# Patient Record
Sex: Female | Born: 1980 | Race: White | Hispanic: No | Marital: Married | Smoking: Never smoker
Health system: Southern US, Community
[De-identification: ages and names within clinical notes are randomized; demographics above are authoritative.]

## PROBLEM LIST (undated history)

## (undated) DIAGNOSIS — G44009 Cluster headache syndrome, unspecified, not intractable: Secondary | ICD-10-CM

## (undated) DIAGNOSIS — Z800 Family history of malignant neoplasm of digestive organs: Secondary | ICD-10-CM

## (undated) DIAGNOSIS — O24419 Gestational diabetes mellitus in pregnancy, unspecified control: Secondary | ICD-10-CM

## (undated) DIAGNOSIS — Z808 Family history of malignant neoplasm of other organs or systems: Secondary | ICD-10-CM

## (undated) DIAGNOSIS — Z807 Family history of other malignant neoplasms of lymphoid, hematopoietic and related tissues: Secondary | ICD-10-CM

## (undated) DIAGNOSIS — Z8042 Family history of malignant neoplasm of prostate: Secondary | ICD-10-CM

## (undated) DIAGNOSIS — Z806 Family history of leukemia: Secondary | ICD-10-CM

## (undated) DIAGNOSIS — R51 Headache: Secondary | ICD-10-CM

## (undated) DIAGNOSIS — Z8481 Family history of carrier of genetic disease: Secondary | ICD-10-CM

## (undated) DIAGNOSIS — Z803 Family history of malignant neoplasm of breast: Secondary | ICD-10-CM

## (undated) DIAGNOSIS — F419 Anxiety disorder, unspecified: Secondary | ICD-10-CM

## (undated) DIAGNOSIS — F32A Depression, unspecified: Secondary | ICD-10-CM

## (undated) DIAGNOSIS — Z8619 Personal history of other infectious and parasitic diseases: Secondary | ICD-10-CM

## (undated) DIAGNOSIS — K589 Irritable bowel syndrome without diarrhea: Secondary | ICD-10-CM

## (undated) DIAGNOSIS — Z62810 Personal history of physical and sexual abuse in childhood: Secondary | ICD-10-CM

## (undated) DIAGNOSIS — Z801 Family history of malignant neoplasm of trachea, bronchus and lung: Secondary | ICD-10-CM

## (undated) HISTORY — DX: Family history of carrier of genetic disease: Z84.81

## (undated) HISTORY — DX: Personal history of physical and sexual abuse in childhood: Z62.810

## (undated) HISTORY — DX: Family history of malignant neoplasm of other organs or systems: Z80.8

## (undated) HISTORY — DX: Personal history of other infectious and parasitic diseases: Z86.19

## (undated) HISTORY — DX: Cluster headache syndrome, unspecified, not intractable: G44.009

## (undated) HISTORY — DX: Irritable bowel syndrome, unspecified: K58.9

## (undated) HISTORY — DX: Anxiety disorder, unspecified: F41.9

## (undated) HISTORY — DX: Family history of other malignant neoplasms of lymphoid, hematopoietic and related tissues: Z80.7

## (undated) HISTORY — DX: Depression, unspecified: F32.A

## (undated) HISTORY — DX: Family history of malignant neoplasm of trachea, bronchus and lung: Z80.1

## (undated) HISTORY — DX: Family history of leukemia: Z80.6

## (undated) HISTORY — PX: BREAST LUMPECTOMY: SHX2

## (undated) HISTORY — DX: Family history of malignant neoplasm of breast: Z80.3

## (undated) HISTORY — DX: Family history of malignant neoplasm of digestive organs: Z80.0

## (undated) HISTORY — PX: WISDOM TOOTH EXTRACTION: SHX21

## (undated) HISTORY — DX: Family history of malignant neoplasm of prostate: Z80.42

---

## 2011-07-24 ENCOUNTER — Other Ambulatory Visit: Payer: Self-pay | Admitting: Obstetrics & Gynecology

## 2011-07-24 DIAGNOSIS — Z803 Family history of malignant neoplasm of breast: Secondary | ICD-10-CM

## 2011-07-24 DIAGNOSIS — N63 Unspecified lump in unspecified breast: Secondary | ICD-10-CM

## 2011-07-28 ENCOUNTER — Other Ambulatory Visit: Payer: Self-pay | Admitting: Obstetrics & Gynecology

## 2011-07-28 ENCOUNTER — Ambulatory Visit
Admission: RE | Admit: 2011-07-28 | Discharge: 2011-07-28 | Disposition: A | Discharge status: Home / Self Care | Payer: 59 | Source: Ambulatory Visit | Attending: Obstetrics & Gynecology | Admitting: Obstetrics & Gynecology

## 2011-07-28 ENCOUNTER — Other Ambulatory Visit: Payer: Self-pay

## 2011-07-28 DIAGNOSIS — N63 Unspecified lump in unspecified breast: Secondary | ICD-10-CM

## 2011-07-28 DIAGNOSIS — Z803 Family history of malignant neoplasm of breast: Secondary | ICD-10-CM

## 2011-07-31 ENCOUNTER — Other Ambulatory Visit: Payer: Self-pay | Admitting: Obstetrics & Gynecology

## 2011-07-31 DIAGNOSIS — Z803 Family history of malignant neoplasm of breast: Secondary | ICD-10-CM

## 2011-07-31 DIAGNOSIS — N63 Unspecified lump in unspecified breast: Secondary | ICD-10-CM

## 2011-08-04 ENCOUNTER — Other Ambulatory Visit: Payer: Self-pay | Admitting: Obstetrics & Gynecology

## 2011-08-04 DIAGNOSIS — Z803 Family history of malignant neoplasm of breast: Secondary | ICD-10-CM

## 2011-08-04 DIAGNOSIS — N63 Unspecified lump in unspecified breast: Secondary | ICD-10-CM

## 2011-08-05 ENCOUNTER — Ambulatory Visit
Admission: RE | Admit: 2011-08-05 | Discharge: 2011-08-05 | Disposition: A | Discharge status: Home / Self Care | Payer: 59 | Source: Ambulatory Visit | Attending: Obstetrics & Gynecology | Admitting: Obstetrics & Gynecology

## 2011-08-05 ENCOUNTER — Inpatient Hospital Stay: Admission: RE | Admit: 2011-08-05 | Payer: 59 | Source: Ambulatory Visit

## 2011-08-05 ENCOUNTER — Other Ambulatory Visit: Payer: Self-pay | Admitting: Obstetrics & Gynecology

## 2011-08-05 DIAGNOSIS — Z803 Family history of malignant neoplasm of breast: Secondary | ICD-10-CM

## 2011-08-05 DIAGNOSIS — N63 Unspecified lump in unspecified breast: Secondary | ICD-10-CM

## 2011-08-05 DIAGNOSIS — N6310 Unspecified lump in the right breast, unspecified quadrant: Secondary | ICD-10-CM

## 2011-08-06 ENCOUNTER — Other Ambulatory Visit: Payer: Self-pay | Admitting: Obstetrics & Gynecology

## 2011-08-06 ENCOUNTER — Telehealth: Payer: Self-pay | Admitting: *Deleted

## 2011-08-06 ENCOUNTER — Telehealth: Payer: Self-pay | Admitting: Genetic Counselor

## 2011-08-06 ENCOUNTER — Ambulatory Visit (HOSPITAL_COMMUNITY)
Admission: RE | Admit: 2011-08-06 | Discharge: 2011-08-06 | Disposition: A | Discharge status: Home / Self Care | Payer: 59 | Source: Ambulatory Visit | Attending: Obstetrics & Gynecology | Admitting: Obstetrics & Gynecology

## 2011-08-06 DIAGNOSIS — Z803 Family history of malignant neoplasm of breast: Secondary | ICD-10-CM

## 2011-08-06 DIAGNOSIS — N6310 Unspecified lump in the right breast, unspecified quadrant: Secondary | ICD-10-CM

## 2011-08-06 DIAGNOSIS — N63 Unspecified lump in unspecified breast: Secondary | ICD-10-CM

## 2011-08-06 MED ORDER — GADOBENATE DIMEGLUMINE 529 MG/ML IV SOLN
13.0000 mL | Freq: Once | INTRAVENOUS | Status: AC | PRN
  Administered 2011-08-06: 13 mL via INTRAVENOUS

## 2011-08-06 NOTE — Telephone Encounter (Signed)
Confirmed 08/07/11 genetic appt w/ pt.  Emailed Leigh at [facility] to make her aware.  Took paperwork to Clydie Braun.

## 2011-08-06 NOTE — Telephone Encounter (Signed)
Asked patient about her sisters test result, which was negative.  Sister lives in [place].

## 2011-08-07 ENCOUNTER — Other Ambulatory Visit: Payer: Self-pay | Admitting: Obstetrics & Gynecology

## 2011-08-07 ENCOUNTER — Ambulatory Visit (HOSPITAL_BASED_OUTPATIENT_CLINIC_OR_DEPARTMENT_OTHER): Payer: 59 | Admitting: Genetic Counselor

## 2011-08-07 ENCOUNTER — Other Ambulatory Visit: Payer: 59 | Admitting: Lab

## 2011-08-07 ENCOUNTER — Encounter: Payer: Self-pay | Admitting: Genetic Counselor

## 2011-08-07 DIAGNOSIS — Z8042 Family history of malignant neoplasm of prostate: Secondary | ICD-10-CM

## 2011-08-07 DIAGNOSIS — Z9889 Other specified postprocedural states: Secondary | ICD-10-CM

## 2011-08-07 DIAGNOSIS — Z808 Family history of malignant neoplasm of other organs or systems: Secondary | ICD-10-CM

## 2011-08-07 DIAGNOSIS — Z803 Family history of malignant neoplasm of breast: Secondary | ICD-10-CM

## 2011-08-07 DIAGNOSIS — N63 Unspecified lump in unspecified breast: Secondary | ICD-10-CM

## 2011-08-07 DIAGNOSIS — Z801 Family history of malignant neoplasm of trachea, bronchus and lung: Secondary | ICD-10-CM

## 2011-08-07 NOTE — Progress Notes (Signed)
Dr.  Jennette Kettle requested a consultation for genetic counseling and risk assessment for Sharon [name], a 31 y.o. female, for discussion of her family history of breast cancer. She presents to clinic today to discuss the possibility of a genetic predisposition to cancer, and to further clarify her risks, as well as her family members' risks for cancer.   HISTORY OF PRESENT ILLNESS: Sharon [name] is a 31 y.o. female with no personal history of cancer.  She has had 3 breast biopsies at age 71 or 86 that were benign.  Recently several breast lumps were found on clinical exam.  She had a mammogram which found several areas and prompted a breast MRI yesterday.  She is awaiting the result of the MRI.  No past medical history on file.  No past surgical history on file.  History  Substance Use Topics  . Smoking status: Not on file  . Smokeless tobacco: Not on file  . Alcohol Use:     REPRODUCTIVE HISTORY AND PERSONAL RISK ASSESSMENT FACTORS: Menarche was at age 12.   Premenopausal Uterus Intact: Yes Ovaries Intact: Yes [id number] , first live birth at age N/A  She has not previously undergone treatment for infertility.   No OCP use   She has not used HRT in the past.    FAMILY HISTORY:  We obtained a detailed, 4-generation family history.  Significant diagnoses are listed below: Family History  Problem Relation Age of Onset  . Cancer Mother 70    IDC breast cancer, 5/19 nodes affected  . Cancer Father 40    lung cancer - fought forest fires for a living  . Cancer Sister 38    DCIS breast cancer x 2; BRCA negative  . Cancer Maternal Grandfather     prostate cancer (late 73s); ? esophogeal cancer (later 91s)   . Cancer Other     Brain cancer (17-21) in maternal grandfather's neice  The patient is awaiting results of her breast MRI that was performed yesterday, August 06, 2011.  Her younger sister was diagnosed with breast cancer last month at the age of 100.  She reportedly has tested negative for  BRCA mutations but it is unknown if further genetic testing has been performed.  The patient's father died of lung cancer at the age of 62.  He was a park ranger who fought forest fires.  Her mother was diagnosed with IDC breast cancer within the last two weeks at age 60.  The patient's maternal grandfather was diagnosed with prostate cancer in his 48s, throat cancer a few years later, and a third cancer that the patient did not know what it was.  He ultimately died at age 12 with dementia.  His sister's daughter was diagnosed with a brain cancer and died between the ages of 31 and 50.  There is no other known cancer history on either side of her family.  Patient's maternal ancestors are of [place] descent, and paternal ancestors are of [place] descent. There is no reported Ashkenazi Jewish ancestry. There is no  known consanguinity.  GENETIC COUNSELING RISK ASSESSMENT, DISCUSSION, AND SUGGESTED FOLLOW UP: We reviewed the natural history and genetic etiology of sporadic, familial and hereditary cancer syndromes.   About 5-10% of breast cancer is hereditary.  Of this, about 85% is the result of a BRCA1 or BRCA2 mutation.  We reviewed the red flags of hereditary cancer syndromes and the dominant inheritance patterns.   If the BRCA testing is negative, we discussed that  we could be testing for the wrong gene.  About 4% of women with breast cancer under the age of 58, who test negative for BRCA mutations, will have a TP53 mutation.  We discussed gene panels, and that several cancer genes that are associated with different cancers can be tested at the same time.  Because of the different types of cancer that are in the patient's family, we will consider one of the panel tests if she is negative for BRCA mutations.  We discussed performing testing on the patient today, vs. Trying to determine if her sister had additional testing pending.  The patient decided to wait on testing to learn about her MRI  results and whether her sister is being tested for other high risk breast cancer genes.  She will call back to schedule an appointment for a blood draw if needed.  The patient's personal and family history is suggestive of the following possible diagnosis: hereditary cancer syndrome  We discussed that identification of a hereditary cancer syndrome may help her care providers tailor the patients medical management. If a mutation indicating a hereditary cancer syndrome is detected in this case, the [employer] recommendations would include increased cancer surveillance and possible prophylactic surgery. If a mutation is detected, the patient will be referred back to the referring provider and to any additional appropriate care providers to discuss the relevant options.   If a mutation is not found in the patient, cancer surveillance options would be discussed for the patient according to the appropriate standard National Comprehensive Cancer Network and American Cancer Society guidelines, with consideration of their personal and family history risk factors. In this case, the patient will be referred back to their care providers for discussions of management.   After considering the risks, benefits, and limitations, the patient decided to put off testing for now until she learns the result of her MRI and whether her sister has pursued other genetic testing.   Per the patient's request, she will contact us by telephone to let us know her decision. A follow up genetic counseling visit will be scheduled if indicated.  The patient was seen for a total of 90 minutes, greater than 50% of which was spent face-to-face counseling.  This plan is being carried out per Dr. Donnetta Hail recommendations.  This note will also be sent to the referring provider via the electronic medical record. The patient will be supplied with a summary of this genetic counseling discussion as well as educational  information on the discussed hereditary cancer syndromes following the conclusion of their visit.   Patient was discussed with Dr. Drue Second.  CC BY [name] MAIL: Dr. Jennette Kettle, [facility]    _______________________________________________________________________ For Office Staff:  Number of people involved in session: 2 Was an Intern/ student involved with case: not applicable

## 2011-08-08 ENCOUNTER — Other Ambulatory Visit (HOSPITAL_COMMUNITY): Payer: 59

## 2011-08-18 ENCOUNTER — Ambulatory Visit
Admission: RE | Admit: 2011-08-18 | Discharge: 2011-08-18 | Disposition: A | Discharge status: Home / Self Care | Payer: 59 | Source: Ambulatory Visit | Attending: Obstetrics & Gynecology | Admitting: Obstetrics & Gynecology

## 2011-08-18 ENCOUNTER — Other Ambulatory Visit: Payer: 59

## 2011-08-18 ENCOUNTER — Other Ambulatory Visit: Payer: Self-pay | Admitting: Obstetrics & Gynecology

## 2011-08-18 DIAGNOSIS — N63 Unspecified lump in unspecified breast: Secondary | ICD-10-CM

## 2012-11-30 LAB — OB RESULTS CONSOLE ANTIBODY SCREEN: Antibody Screen: NEGATIVE

## 2012-11-30 LAB — OB RESULTS CONSOLE RUBELLA ANTIBODY, IGM: Rubella: IMMUNE

## 2012-11-30 LAB — OB RESULTS CONSOLE ABO/RH: RH Type: POSITIVE

## 2012-11-30 LAB — OB RESULTS CONSOLE RPR: RPR: NONREACTIVE

## 2012-11-30 LAB — OB RESULTS CONSOLE HIV ANTIBODY (ROUTINE TESTING): HIV: NONREACTIVE

## 2012-11-30 LAB — OB RESULTS CONSOLE HEPATITIS B SURFACE ANTIGEN: Hepatitis B Surface Ag: NEGATIVE

## 2012-11-30 LAB — OB RESULTS CONSOLE GC/CHLAMYDIA
Chlamydia: NEGATIVE
Gonorrhea: NEGATIVE

## 2012-12-14 ENCOUNTER — Other Ambulatory Visit: Payer: Self-pay | Admitting: Obstetrics & Gynecology

## 2012-12-14 DIAGNOSIS — N6310 Unspecified lump in the right breast, unspecified quadrant: Secondary | ICD-10-CM

## 2012-12-15 ENCOUNTER — Ambulatory Visit
Admission: RE | Admit: 2012-12-15 | Discharge: 2012-12-15 | Disposition: A | Discharge status: Home / Self Care | Payer: 59 | Source: Ambulatory Visit | Attending: Obstetrics & Gynecology | Admitting: Obstetrics & Gynecology

## 2012-12-15 ENCOUNTER — Other Ambulatory Visit: Payer: Self-pay | Admitting: Obstetrics & Gynecology

## 2012-12-15 DIAGNOSIS — N6310 Unspecified lump in the right breast, unspecified quadrant: Secondary | ICD-10-CM

## 2013-02-10 NOTE — L&D Delivery Note (Signed)
Delivery Note At 9:28 AM a viable female was delivered via Vaginal, Spontaneous Delivery (Presentation: Right Occiput Anterior).  APGAR: 4, 8; weight .   Placenta status: Intact, Spontaneous.  Cord: 3 vessels with the following complications: None.  Cord pH: na  Anesthesia: None  Episiotomy: none Lacerations: first degree Suture Repair: 2.0 vicryl rapide Est. Blood Loss (mL): 350  Mom to postpartum.  Baby to Couplet care / Skin to Skin.  Raphael GibneyJohn S Novalyn Lajara 06/28/2013, 9:46 AM

## 2013-06-25 IMAGING — US US CORE BIOPSY RIGHT
1 series · 13 of 15 positions shown · non-contrast
Comparison: Previous exams

***ADDENDUM*** CREATED: 08/19/2011 [DATE]

The right breast ultrasound guided core biopsy demonstrated a
benign fibroadenoma.  There were no atypical cells, no evidence for
malignancy,  and no findings worrisome for a phylloides tumor.  The
pathology is concordant with imaging findings.  I have discussed
the findings with the patient by telephone and answered her
questions.  The patient states that her biopsy site is clean and
dry without hematoma formation.  Post biopsy wound care
instructions were reviewed with the patient.  I recommend follow-up
bilateral breast ultrasound in 6 months. Also, breast self-
examination was reviewed with the patient.  The patient was
encouraged to call the [REDACTED] for additional questions or
concerns.
***END ADDENDUM*** SIGNED BY: Nabilo Saiche, M.D.
CLINICAL DATA: Family history of breast cancer in her mother at
age 60 and her sister at age 21.  Previous surgical excisions of
multiple bilateral benign fibroadenomas.  Multiple bilateral
circumscribed masses are now noted on breast ultrasound and breast
MRI most consistent with multiple residual fibroadenomas. Following
discussion with the patient with regards to her imaging findings
and her family history, the patient prefers ultrasound guided core
biopsy of the largest palpable mass (probable fibroadenoma) located
within the right breast at the 9 o'clock position. If this
demonstrates a benign fibroadenoma, ultrasound follow-up evaluation
of the additional lesions will be performed.
ULTRASOUND GUIDED CORE BIOPSY OF THE right BREAST

[Series 3: us core biopsy right · 13 of 15 slices shown]
[im 1/15]
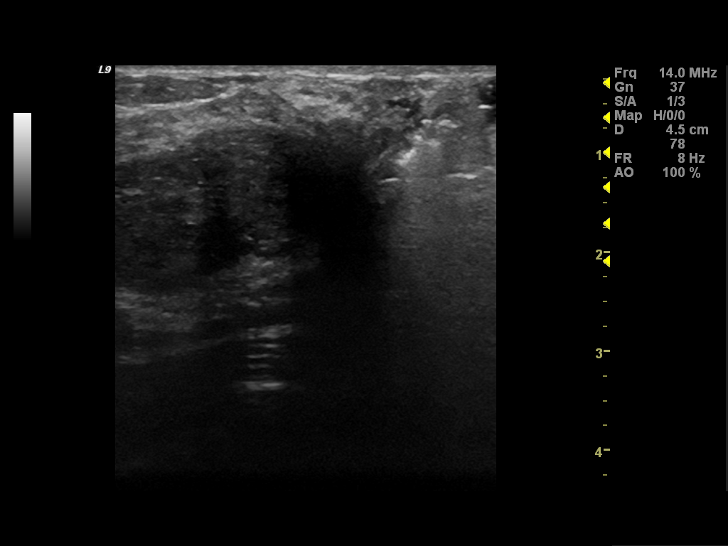
[im 2/15]
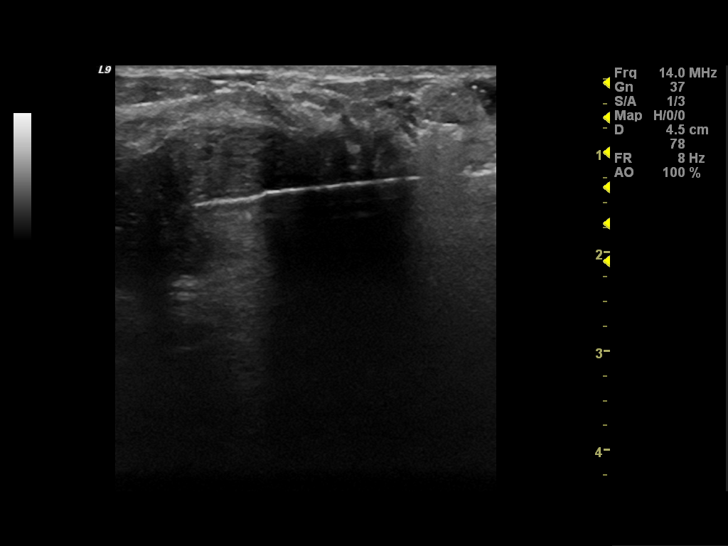
[im 3/15]
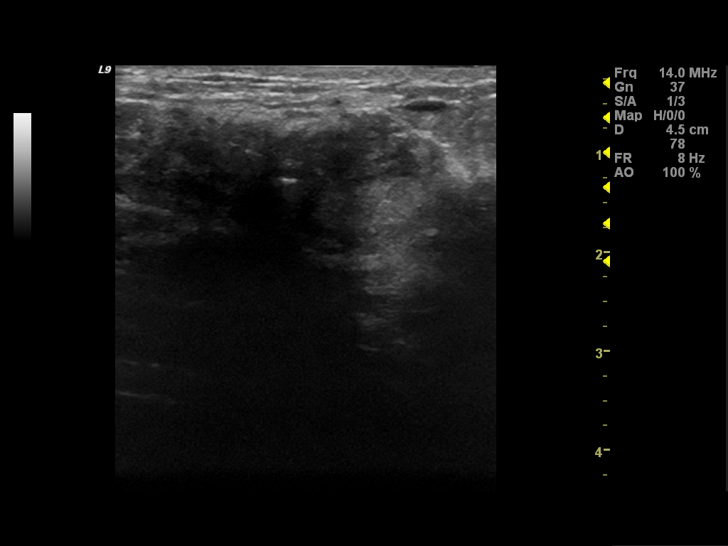
[im 5/15]
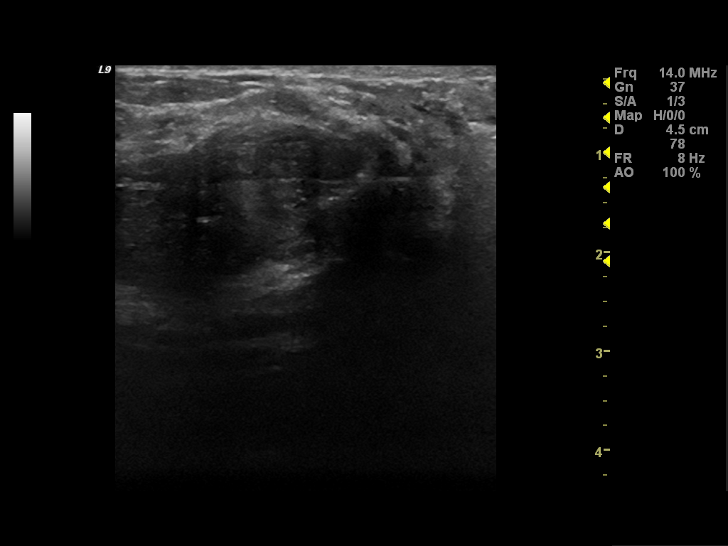
[im 6/15]
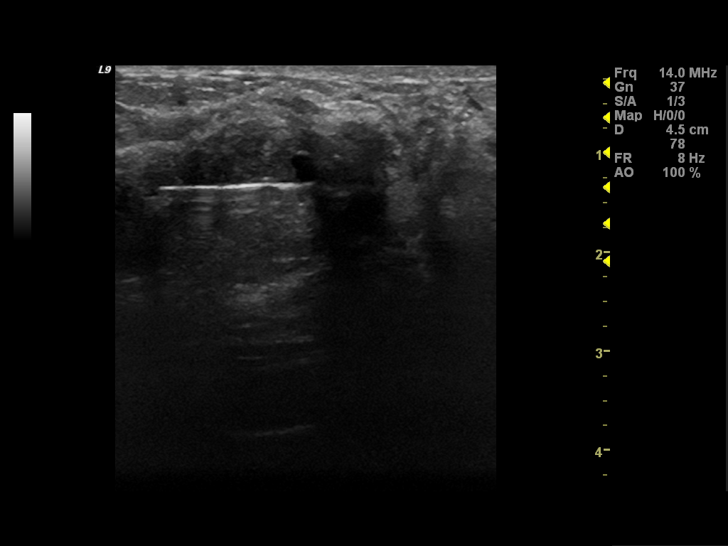
[im 7/15]
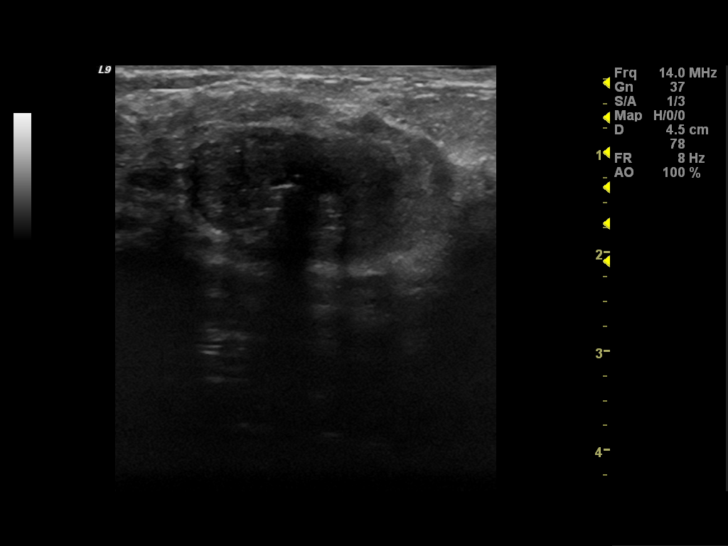
[im 8/15]
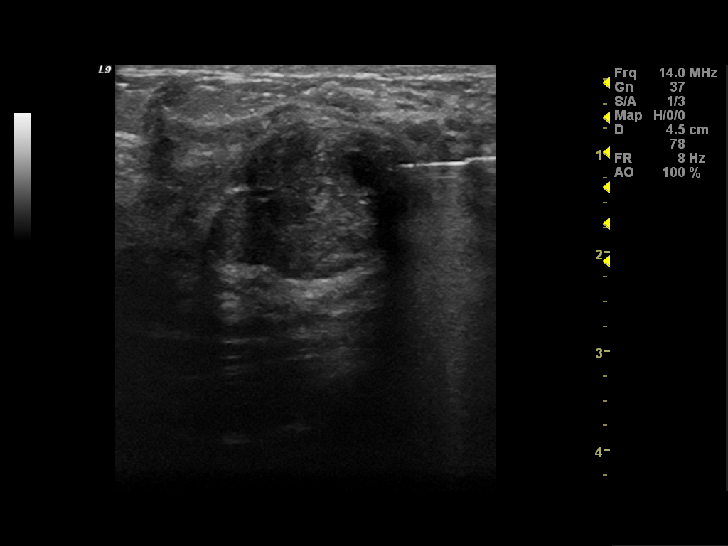
[im 9/15]
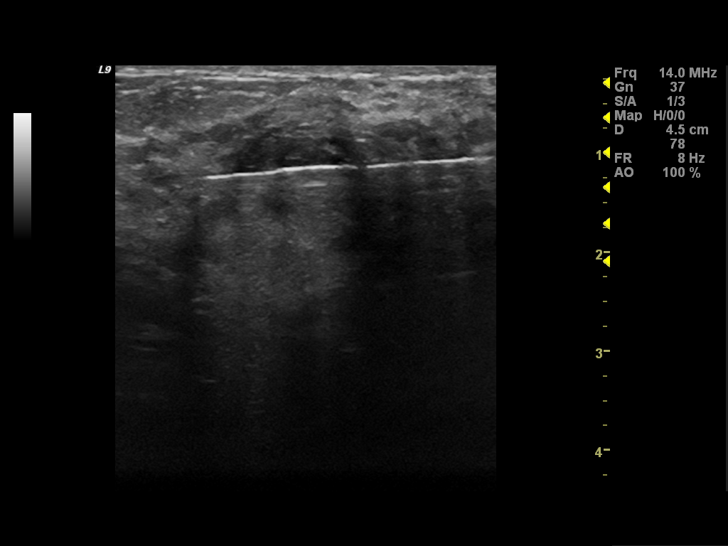
[im 10/15]
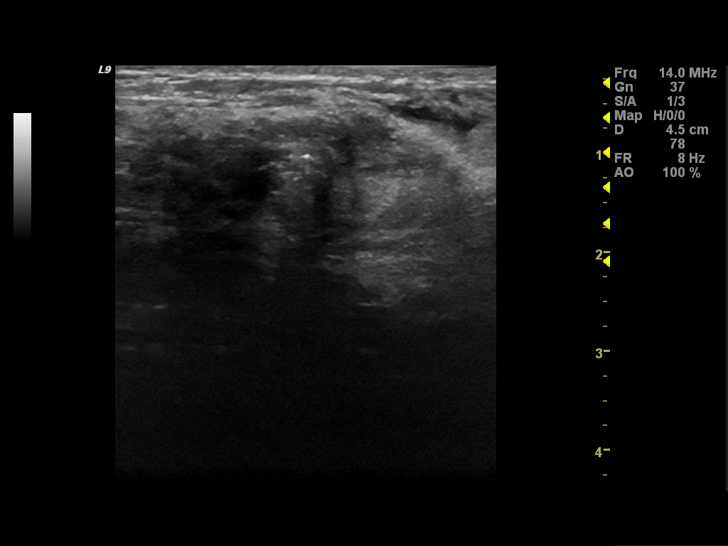
[im 11/15]
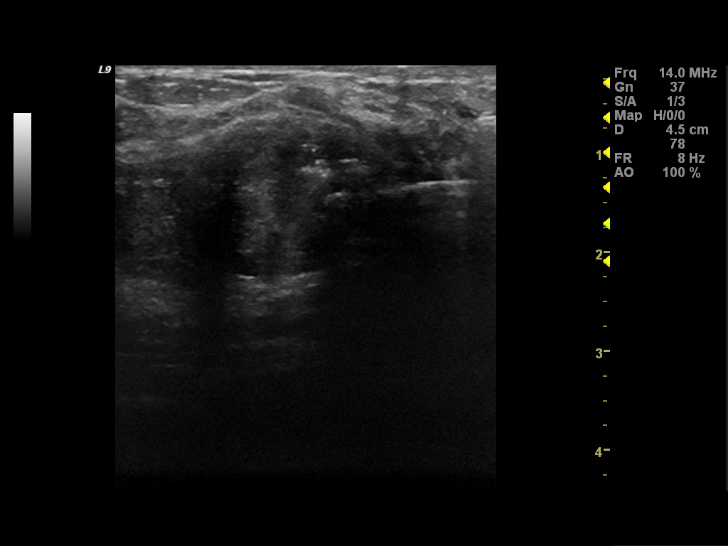
[im 13/15]
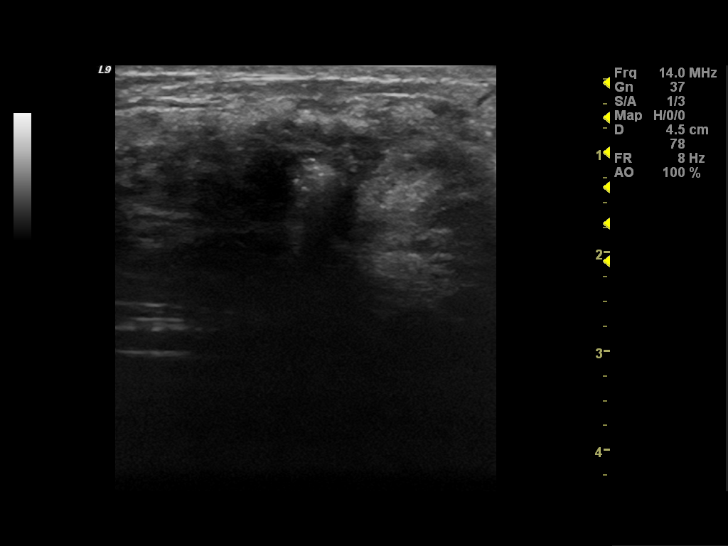
[im 14/15]
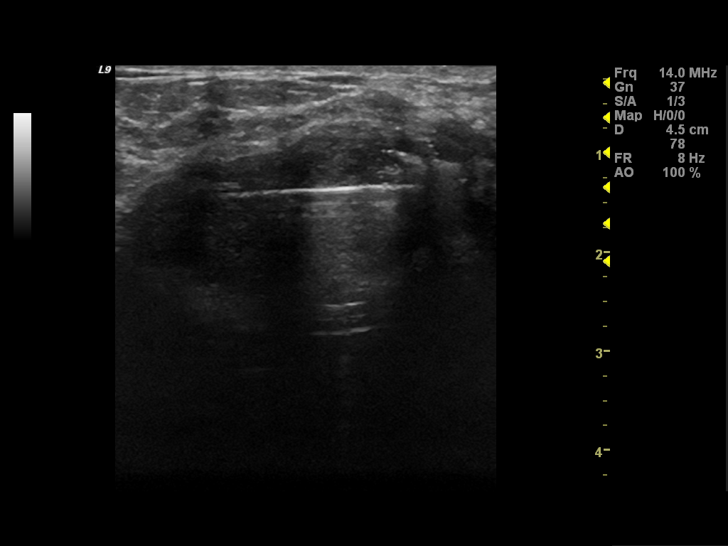
[im 15/15]
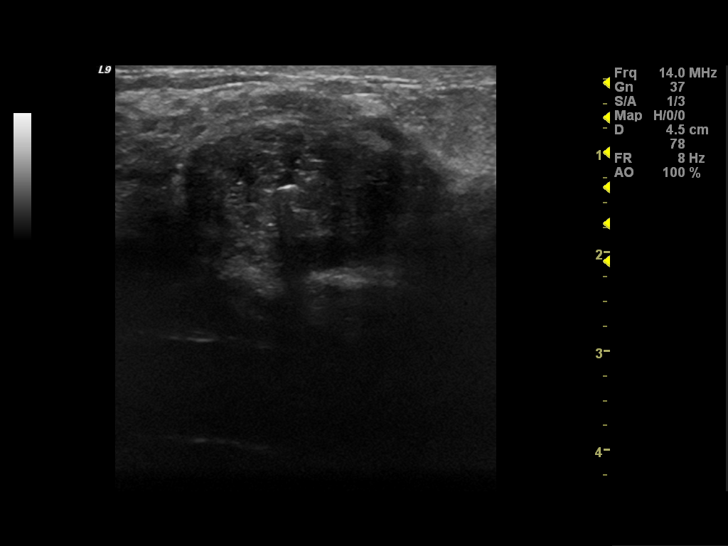

[13 of 15 positions shown; findings below may reference images not displayed]

I met with the patient and we discussed the procedure of ultrasound-
guided biopsy, including benefits and alternatives.  We discussed
the high likelihood of a successful procedure. We discussed the
risks of the procedure, including infection, bleeding, tissue
injury, clip migration, and inadequate sampling.  Informed written
consent was given.

Using sterile technique 2% lidocaine, ultrasound guidance and a 14
gauge automated biopsy device, biopsy was performed of the palpable
mass located within the right breast at the 9 o'clock position.  At
the conclusion of the procedure a ribbon shaped tissue marker clip
was deployed into the biopsy cavity. Due to the patient's age, a
post clip placement mammogram was not performed.
IMPRESSION: Ultrasound guided biopsy of the palpable mass located within the
right breast nine o'clock position.  No apparent complications.

## 2013-06-27 ENCOUNTER — Telehealth (HOSPITAL_COMMUNITY): Payer: Self-pay | Admitting: *Deleted

## 2013-06-27 ENCOUNTER — Encounter (HOSPITAL_COMMUNITY): Payer: Self-pay | Admitting: *Deleted

## 2013-06-27 ENCOUNTER — Encounter (HOSPITAL_COMMUNITY): Payer: Self-pay

## 2013-06-27 ENCOUNTER — Inpatient Hospital Stay (HOSPITAL_COMMUNITY)
Admission: AD | Admit: 2013-06-27 | Discharge: 2013-06-27 | Disposition: A | Discharge status: Home / Self Care | Payer: 59 | Source: Ambulatory Visit | Attending: Obstetrics & Gynecology | Admitting: Obstetrics & Gynecology

## 2013-06-27 DIAGNOSIS — O4690 Antepartum hemorrhage, unspecified, unspecified trimester: Secondary | ICD-10-CM | POA: Insufficient documentation

## 2013-06-27 HISTORY — DX: Headache: R51

## 2013-06-27 LAB — OB RESULTS CONSOLE GBS: GBS: NEGATIVE

## 2013-06-27 NOTE — MAU Note (Signed)
Pt noted bloody show, no clots or lof.

## 2013-06-27 NOTE — Progress Notes (Signed)
Notified pt here for bloody show, cervical exam 1.5/60/-1 intact. EFM tracing reactive. Will d/c pt. Have pt keep scheduled appointment in office today.

## 2013-06-27 NOTE — Telephone Encounter (Signed)
Preadmission screen  

## 2013-06-27 NOTE — MAU Note (Signed)
Patient states she has had bloody show this am that has gotten brighter red and more. States she is having mild pain, not sure if contractions. States baby was moving well this am but has not felt movement in about 20 minutes.

## 2013-06-27 NOTE — Discharge Instructions (Signed)
Labor Induction  Labor induction is when steps are taken to cause a pregnant woman to begin the labor process. Most women go into labor on their own between 37 weeks and 42 weeks of the pregnancy. When this does not happen or when there is a medical need, methods may be used to induce labor. Labor induction causes a pregnant woman's uterus to contract. It also causes the cervix to soften (ripen), open (dilate), and thin out (efface). Usually, labor is not induced before 39 weeks of the pregnancy unless there is a problem with the baby or mother.  Before inducing labor, your health care provider will consider a number of factors, including the following:  The medical condition of you and the baby.   How many weeks along you are.   The status of the baby's lung maturity.   The condition of the cervix.   The position of the baby.  WHAT ARE THE REASONS FOR LABOR INDUCTION? Labor may be induced for the following reasons:  The health of the baby or mother is at risk.   The pregnancy is overdue by 1 week or more.   The water breaks but labor does not start on its own.   The mother has a health condition or serious illness, such as high blood pressure, infection, placental abruption, or diabetes.  The amniotic fluid amounts are low around the baby.   The baby is distressed.  Convenience or wanting the baby to be born on a certain date is not a reason for inducing labor. WHAT METHODS ARE USED FOR LABOR INDUCTION? Several methods of labor induction may be used, such as:   Prostaglandin medicine. This medicine causes the cervix to dilate and ripen. The medicine will also start contractions. It can be taken by mouth or by inserting a suppository into the vagina.   Inserting a thin tube (catheter) with a balloon on the end into the vagina to dilate the cervix. Once inserted, the balloon is expanded with water, which causes the cervix to open.   Stripping the membranes. Your health  care provider separates amniotic sac tissue from the cervix, causing the cervix to be stretched and causing the release of a hormone called progesterone. This may cause the uterus to contract. It is often done during an office visit. You will be sent home to wait for the contractions to begin. You will then come in for an induction.   Breaking the water. Your health care provider makes a hole in the amniotic sac using a small instrument. Once the amniotic sac breaks, contractions should begin. This may still take hours to see an effect.   Medicine to trigger or strengthen contractions. This medicine is given through an IV access tube inserted into a vein in your arm.  All of the methods of induction, besides stripping the membranes, will be done in the hospital. Induction is done in the hospital so that you and the baby can be carefully monitored.  HOW LONG DOES IT TAKE FOR LABOR TO BE INDUCED? Some inductions can take up to 2 3 days. Depending on the cervix, it usually takes less time. It takes longer when you are induced early in the pregnancy or if this is your first pregnancy. If a mother is still pregnant and the induction has been going on for 2 3 days, either the mother will be sent home or a cesarean delivery will be needed. WHAT ARE THE RISKS ASSOCIATED WITH LABOR INDUCTION? Some of the risks   of induction include:   Changes in fetal heart rate, such as too high, too low, or erratic.   Fetal distress.   Chance of infection for the mother and baby.   Increased chance of having a cesarean delivery.   Breaking off (abruption) of the placenta from the uterus (rare).   Uterine rupture (very rare).  When induction is needed for medical reasons, the benefits of induction may outweigh the risks. WHAT ARE SOME REASONS FOR NOT INDUCING LABOR? Labor induction should not be done if:   It is shown that your baby does not tolerate labor.   You have had previous surgeries on your  uterus, such as a myomectomy or the removal of fibroids.   Your placenta lies very low in the uterus and blocks the opening of the cervix (placenta previa).   Your baby is not in a head-down position.   The umbilical cord drops down into the birth canal in front of the baby. This could cut off the baby's blood and oxygen supply.   You have had a previous cesarean delivery.   There are unusual circumstances, such as the baby being extremely premature.  Document Released: 06/18/2006 Document Revised: 09/29/2012 Document Reviewed: 08/26/2012 [facility] Patient Information 2014 [place], [place].  Natural Childbirth Natural childbirth is going through labor and delivery without any drugs to relieve pain. You also do not use fetal monitors, have a cesarean delivery, or get a sugical cut to enlarge the vaginal opening (episiotomy). With the help of a birthing professional (midwife), you will direct your own labor and delivery as you choose. Many women chose natural childbirth because they feel more in control and in touch with their labor and delivery. They are also concerned about the medications affecting themselves and the baby. Pregnant women with a high risk pregnancy should not attempt natural childbirth. It is better to deliver the infant in a hospital if an emergency situation arises. Sometimes, the caregiver has to intervene for the health and safety of the mother and infant. TWO TECHNIQUES FOR NATURAL CHILDBIRTH:   The Lamaze method. This method teaches women that having a baby is normal, healthy, and natural. It also teaches the mother to take a neutral position regarding pain medication and anesthesia and to make an informed decision if and when it is right for them.  The Erven CollaBradley method (also called husband coached birth). This method teaches the father to be the birth coach and stresses a natural approach. It also encourages exercise and a balanced diet with good nutrition. The  exercises teach relaxation and deep breathing techniques. However, there are also classes to prepare the parents for an emergency situation that may occur. METHODS OF DEALING WITH LABOR PAIN AND DELIVERY:  Meditation.  Yoga.  Hypnosis.  Acupuncture.  Massage.  Changing positions (walking, rocking, showering, leaning on birth balls).  Lying in warm water or a jacuzzi.  Find an activity that keeps your mind off of the labor pain.  Listen to soft music.  Visual imagery (focus on a particular object). BEFORE GOING INTO LABOR  Be sure you and your spouse/partner are in agreement to have natural childbirth.  Decide if your caregiver or a midwife will deliver your baby.  Decide if you will have your baby in the hospital, birthing center, or at home.  If you have children, make plans to have someone to take care of them when you go to the hospital.  Know the distance and the time it takes to go to  the delivery center. Make a dry run to be sure.  Have a bag packed with a night gown, bathrobe, and toiletries ready to take when you go into labor.  Keep phone numbers of your family and friends handy if you need to call someone when you go into labor.  Your spouse or partner should go to all the teaching classes.  Talk with your caregiver about the possibility of a medical emergency and what will happen if that occurs. ADVANTAGES OF NATURAL CHILDBIRTH  You are in control of your labor and delivery.  It is safe.  There are no medications or anesthetics that may affect you and the fetus.  There are no invasive procedures such as an episiotomy.  You and your partner will work together, which can increase your bond.  Meditation, yoga, massage, and breathing exercises can be learned while pregnant and help you when you are in labor and at delivery.  In most delivery centers, the family and friends can be involved in the labor and delivery process. DISADVANTAGES OF NATURAL  CHILDBIRTH  You will experience pain during your labor and delivery.  The methods of helping relieve your labor pains may not work for you.  You may feel embarrassed, disappointed, and like a failure if you decide to change your mind during labor and not have natural childbirth. AFTER THE DELIVERY  You will be very tired.  You will be uncomfortable because of your uterus contracting. You will feel soreness around the vagina.  You may feel cold and shaky.This is a natural reaction.  You will be excited, overwhelmed, accomplished, and proud to be a mother. HOME CARE INSTRUCTIONS   Follow the advice and instructions of your caregiver.  Follow the instructions of your natural childbirth instructor (Lamaze or Bradley Method). Document Released: 01/10/2008 Document Revised: 04/21/2011 Document Reviewed: 10/04/2012 [facility] Patient Information 2014 [place]ExitCare, [place].

## 2013-06-28 ENCOUNTER — Inpatient Hospital Stay (HOSPITAL_COMMUNITY): Admission: RE | Admit: 2013-06-28 | Payer: 59 | Source: Ambulatory Visit

## 2013-06-28 ENCOUNTER — Encounter (HOSPITAL_COMMUNITY): Payer: Self-pay | Admitting: *Deleted

## 2013-06-28 ENCOUNTER — Inpatient Hospital Stay (HOSPITAL_COMMUNITY)
Admission: AD | Admit: 2013-06-28 | Discharge: 2013-06-30 | DRG: 775 | Disposition: A | Discharge status: Home / Self Care | Payer: 59 | Source: Ambulatory Visit | Attending: Obstetrics and Gynecology | Admitting: Obstetrics and Gynecology

## 2013-06-28 DIAGNOSIS — K589 Irritable bowel syndrome without diarrhea: Secondary | ICD-10-CM | POA: Diagnosis present

## 2013-06-28 DIAGNOSIS — Z3490 Encounter for supervision of normal pregnancy, unspecified, unspecified trimester: Secondary | ICD-10-CM

## 2013-06-28 LAB — CBC
HCT: 39.5 % (ref 36.0–46.0)
Hemoglobin: 14.1 g/dL (ref 12.0–15.0)
MCH: 31.1 pg (ref 26.0–34.0)
MCHC: 35.7 g/dL (ref 30.0–36.0)
MCV: 87.2 fL (ref 78.0–100.0)
Platelets: 242 10*3/uL (ref 150–400)
RBC: 4.53 MIL/uL (ref 3.87–5.11)
RDW: 13.1 % (ref 11.5–15.5)
WBC: 18.3 10*3/uL — ABNORMAL HIGH (ref 4.0–10.5)

## 2013-06-28 LAB — RPR

## 2013-06-28 MED ORDER — DIPHENHYDRAMINE HCL 25 MG PO CAPS: 25.0000 mg | ORAL_CAPSULE | Freq: Four times a day (QID) | ORAL | Status: DC | PRN

## 2013-06-28 MED ORDER — CITRIC ACID-SODIUM CITRATE 334-500 MG/5ML PO SOLN: 30.0000 mL | ORAL | Status: DC | PRN

## 2013-06-28 MED ORDER — ONDANSETRON HCL 4 MG/2ML IJ SOLN: 4.0000 mg | Freq: Four times a day (QID) | INTRAMUSCULAR | Status: DC | PRN

## 2013-06-28 MED ORDER — OXYTOCIN BOLUS FROM INFUSION
500.0000 mL | INTRAVENOUS | Status: DC
  Administered 2013-06-28: 500 mL via INTRAVENOUS

## 2013-06-28 MED ORDER — ZOLPIDEM TARTRATE 5 MG PO TABS: 5.0000 mg | ORAL_TABLET | Freq: Every evening | ORAL | Status: DC | PRN

## 2013-06-28 MED ORDER — IBUPROFEN 600 MG PO TABS: 600.0000 mg | ORAL_TABLET | Freq: Four times a day (QID) | ORAL | Status: DC | PRN

## 2013-06-28 MED ORDER — ONDANSETRON HCL 4 MG PO TABS: 4.0000 mg | ORAL_TABLET | ORAL | Status: DC | PRN

## 2013-06-28 MED ORDER — ACETAMINOPHEN 325 MG PO TABS: 650.0000 mg | ORAL_TABLET | ORAL | Status: DC | PRN

## 2013-06-28 MED ORDER — LANOLIN HYDROUS EX OINT: TOPICAL_OINTMENT | CUTANEOUS | Status: DC | PRN

## 2013-06-28 MED ORDER — FLEET ENEMA 7-19 GM/118ML RE ENEM: 1.0000 | ENEMA | RECTAL | Status: DC | PRN

## 2013-06-28 MED ORDER — OXYTOCIN 40 UNITS IN LACTATED RINGERS INFUSION - SIMPLE MED
62.5000 mL/h | INTRAVENOUS | Status: DC
  Administered 2013-06-28: 62.5 mL/h via INTRAVENOUS
  Filled 2013-06-28: qty 1000

## 2013-06-28 MED ORDER — ONDANSETRON HCL 4 MG/2ML IJ SOLN: 4.0000 mg | INTRAMUSCULAR | Status: DC | PRN

## 2013-06-28 MED ORDER — OXYCODONE-ACETAMINOPHEN 5-325 MG PO TABS: 1.0000 | ORAL_TABLET | ORAL | Status: DC | PRN

## 2013-06-28 MED ORDER — BISACODYL 10 MG RE SUPP: 10.0000 mg | Freq: Every day | RECTAL | Status: DC | PRN

## 2013-06-28 MED ORDER — LIDOCAINE HCL (PF) 1 % IJ SOLN
30.0000 mL | INTRAMUSCULAR | Status: AC | PRN
  Administered 2013-06-28: 30 mL via SUBCUTANEOUS
  Filled 2013-06-28: qty 30

## 2013-06-28 MED ORDER — BENZOCAINE-MENTHOL 20-0.5 % EX AERO
1.0000 "application " | INHALATION_SPRAY | CUTANEOUS | Status: DC | PRN
  Filled 2013-06-28: qty 56

## 2013-06-28 MED ORDER — FLEET ENEMA 7-19 GM/118ML RE ENEM: 1.0000 | ENEMA | Freq: Every day | RECTAL | Status: DC | PRN

## 2013-06-28 MED ORDER — SIMETHICONE 80 MG PO CHEW: 80.0000 mg | CHEWABLE_TABLET | ORAL | Status: DC | PRN

## 2013-06-28 MED ORDER — PRENATAL MULTIVITAMIN CH
1.0000 | ORAL_TABLET | Freq: Every day | ORAL | Status: DC
  Administered 2013-06-28 – 2013-06-29 (×2): 1 via ORAL
  Filled 2013-06-28 (×2): qty 1

## 2013-06-28 MED ORDER — IBUPROFEN 600 MG PO TABS
600.0000 mg | ORAL_TABLET | Freq: Four times a day (QID) | ORAL | Status: DC
  Administered 2013-06-28 – 2013-06-30 (×7): 600 mg via ORAL
  Filled 2013-06-28 (×7): qty 1

## 2013-06-28 MED ORDER — WITCH HAZEL-GLYCERIN EX PADS: 1.0000 "application " | MEDICATED_PAD | CUTANEOUS | Status: DC | PRN

## 2013-06-28 MED ORDER — LACTATED RINGERS IV SOLN: INTRAVENOUS | Status: DC

## 2013-06-28 MED ORDER — DIBUCAINE 1 % RE OINT: 1.0000 "application " | TOPICAL_OINTMENT | RECTAL | Status: DC | PRN

## 2013-06-28 MED ORDER — SENNOSIDES-DOCUSATE SODIUM 8.6-50 MG PO TABS
2.0000 | ORAL_TABLET | ORAL | Status: DC
  Administered 2013-06-29 (×2): 2 via ORAL
  Filled 2013-06-28 (×3): qty 2

## 2013-06-28 MED ORDER — TETANUS-DIPHTH-ACELL PERTUSSIS 5-2.5-18.5 LF-MCG/0.5 IM SUSP: 0.5000 mL | Freq: Once | INTRAMUSCULAR | Status: DC

## 2013-06-28 MED ORDER — LACTATED RINGERS IV SOLN
500.0000 mL | INTRAVENOUS | Status: DC | PRN
  Administered 2013-06-28: 500 mL via INTRAVENOUS
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
## 2013-06-28 NOTE — Lactation Note (Addendum)
This note was copied from the chart of Girl Rollen SoxMary Erb. 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 Mom reports breast changes 3 x's during pregnancy.  Per mom baby hasn't latched yet . LC assessed breast tissue and noted mom to have semi flat nipple on the left and flat on the right. Requiring intervention of shells , hand pump , which LC changed to a DEBP at consult , nipple shield ( sized for both #16 and #20 NS and the #20 NS fit best ) . Baby latched with #20 NS on the left breast and was sitting with the nipple shield in her mouth , after 20 mins suckled per mom for a few strong sucks. Colostrum noted on the areola and base of the  Nipple shield . LC applied the colostrum on baby's lips and baby noted to be spitty intermittently.  Left nipple Corena Pilgrim/areola a borderline candidate for a nipple shield and the right isn't a candidate for nipple shield at this point in time . ( probably after the use shells , and consistent  Pumping the areola will become more compressible and the nipple more erect for a better latch . Per mom my breast are not normally like this and they feel full , ( Edema bilaterally noted )  Lactation plan of care -  due to challenging tissue semi flat on the left , and flat on the right , per mom prior to delivery her tissue was more erect , probably a DEBP will be needed early on due to challenging tissue , mom aware . see LC note  Breast massage , prepump both breast for 5 mins and apply #20 Nipple shield , if EBM available ( instill EBM with curved syringe ) , latch with firm support and let baby feed ( 10 -20 mins) or greater . After feeding at the breast post pump 10 mins and save milk. Unable to hand express milk to spoon feed . Mom  has been doing a lot of skin to skin.  Discussed with mom if the baby gets really hungry due to challenging tissue , EBM will be used 1st , but if not available will need to use formula with curved syringe and nipple shield.  Mother informed of post-discharge support and given phone number to the lactation department, including services for phone call assistance; out-patient appointments; and breastfeeding support group. List of other breastfeeding resources in the community given in the handout. Encouraged mother to call for problems or concerns related to breastfeeding.   Maternal Data Formula Feeding for Exclusion: No Infant to breast within first hour of birth: Yes (attempted per mom ) Has patient been taught Hand Expression?: Yes Does the patient have breastfeeding experience prior to this delivery?: No  Feeding Feeding Type: Breast Fed  LATCH Score/Interventions Latch: Repeated attempts needed to sustain latch, nipple held in mouth throughout feeding, stimulation needed to elicit sucking reflex. (cross cradle ) Intervention(s): Skin to skin;Teach feeding cues;Waking techniques Intervention(s): Adjust position;Assist with latch;Breast massage;Breast compression  Audible Swallowing: None  Type of Nipple: Flat (pre-pumped with hand pump )  Comfort (Breast/Nipple): Soft / non-tender     Hold (Positioning): Assistance needed to correctly position infant at breast and maintain latch. Intervention(s): Breastfeeding basics reviewed (reviewed LC plan  of care see LC note )  LATCH Score: 5  Lactation Tools Discussed/Used Tools: Shells;Pump Nipple shield size: 20;16 (sized for both #20 NS fit the best ) Shell Type: Inverted Breast pump type: Double-Electric Breast Pump (added a DEBP due to challenging tissue ) WIC Program: No Pump Review: Setup, frequency, and cleaning Initiated by:: MAI  Date initiated:: 06/28/13   Consult Status Consult Status: Follow-up Date:  06/29/13 Follow-up type: In-patient    Matilde SprangMargaret Ann Everest Hacking 06/28/2013, 6:45 PM

## 2013-06-28 NOTE — Progress Notes (Signed)
Pulse ox applied to determine maternal HR vs fetal HR

## 2013-06-28 NOTE — MAU Note (Signed)
Contractions every 3-4 minutes for the last hour with bloody show. +FM

## 2013-06-29 LAB — CBC
HCT: 34.2 % — ABNORMAL LOW (ref 36.0–46.0)
Hemoglobin: 11.8 g/dL — ABNORMAL LOW (ref 12.0–15.0)
MCH: 30.8 pg (ref 26.0–34.0)
MCHC: 34.5 g/dL (ref 30.0–36.0)
MCV: 89.3 fL (ref 78.0–100.0)
Platelets: 250 10*3/uL (ref 150–400)
RBC: 3.83 MIL/uL — ABNORMAL LOW (ref 3.87–5.11)
RDW: 13.8 % (ref 11.5–15.5)
WBC: 17.2 10*3/uL — ABNORMAL HIGH (ref 4.0–10.5)

## 2013-06-29 NOTE — Lactation Note (Signed)
This note was copied from the chart of Girl Rollen SoxMary Carder. Lactation Consultation Note  Patient Name: Girl Rollen SoxMary Lichty WGNFA'OToday's Date: 06/29/2013 Reason for consult: Follow-up assessment  Baby 32 hours of life. Called to room by patient's MBU nurse to assist and assess with feeding. Mom was able to hand express 5mls of colostrum from right breast and baby was given EBL with spoon. LC then assisted mom to latch baby to left breast with #20 NS that mom had hand expressed some colostrum into. Baby sucked long enough to remove colostrum and then fell asleep. Woke baby several times and baby would suck rhythmically with a few swallows and then fall asleep. FOB held infant while mom hand expressed additional 6mls of breast milk that was also spoon fed to baby. Baby tolerated spoon-feeding well. Enc mom to use DEBP in one hour and have on-hand for next feeding. Enc mom to hand express after the pump cuts off in order to drain breast as much as she can. Enc mom to attempt to feed baby again within 3 hours, earlier if baby shows cues. Enc mom to again use NS with EBM in the NS. If baby will not stay awake and nurse, mom can spoon feed using the supplementation chart left at bedside. Mom enc to call out for assistance and discussed using formula to supplement if mom is not able to pump and hand express enough. Mom's right breast is producing more colostrum than left breast. Enc mom to massage breasts and pump every 3 hours followed by hand expression as baby is not stimulating breasts for milk production. Mom agreeable to schedule and seems eager to have baby at breast. Enc mom to call out for assistance as needed as FOB will not be staying the night. Reviewed plan with patient's MBU nurse. Maternal Data    Feeding Feeding Type: Breast Fed Length of feed: 5 min  LATCH Score/Interventions Latch: Repeated attempts needed to sustain latch, nipple held in mouth throughout feeding, stimulation needed to elicit sucking  reflex. Intervention(s): Skin to skin;Waking techniques Intervention(s): Adjust position;Assist with latch;Breast massage;Breast compression  Audible Swallowing: A few with stimulation  Type of Nipple: Flat  Comfort (Breast/Nipple): Soft / non-tender     Hold (Positioning): Assistance needed to correctly position infant at breast and maintain latch. Intervention(s): Breastfeeding basics reviewed;Support Pillows  LATCH Score: 6  Lactation Tools Discussed/Used Tools: Nipple Shields Nipple shield size: 20   Consult Status Consult Status: Follow-up Follow-up type: In-patient    Sherlyn HayJennifer D Aleina Burgio 06/29/2013, 6:27 PM

## 2013-06-29 NOTE — Progress Notes (Signed)
Post Partum Day 1 Subjective: no complaints, up ad lib, voiding, tolerating PO and + flatus  Objective: Blood pressure 107/65, pulse 64, temperature 97.9 F (36.6 C), temperature source Oral, resp. rate 16, height 5' 6.75" (1.695 m), weight 171 lb 9.6 oz (77.837 kg), SpO2 97.00%, unknown if currently breastfeeding.  Physical Exam:  General: alert and cooperative Lochia: appropriate Uterine Fundus: firm Incision: perineum intact, small labial edema DVT Evaluation: No evidence of DVT seen on physical exam. Negative Homan's sign. No cords or calf tenderness. No significant calf/ankle edema.   Recent Labs  06/28/13 0155 06/29/13 0614  HGB 14.1 11.8*  HCT 39.5 34.2*    Assessment/Plan: Plan for discharge tomorrow   LOS: 1 day   Sharon [name] 06/29/2013, 7:53 AM

## 2013-06-29 NOTE — Lactation Note (Addendum)
This note was copied from the chart of Sharon [name]. Lactation Consultation Note      Follow up consult with this mom of a term baby, now 25 hours post partum. Mom has edematous breasts, and flat nipples due to this, so is pre pumping. She is expressing good drops of colostrum. Hand expression reviewed, and mom demonstrated with good technique. We attempted latching baby without nipple shield to right breast, in football hold. She l;atched and suckled for about 3 minutes, but was not able to maintain. 20 nipple shield applied, and mom shown how to do this. Baby suckled on and off with shield. Mom also given foley cup to use with hand expression, and feed baby. She will call for help if this tool is used. Mom very receptive to teaching,  Patient Name: Sharon [name] ZOXWR'UToday's Date: 06/29/2013 Reason for consult: Follow-up assessment   Maternal Data    Feeding Feeding Type: Breast Fed Length of feed: 15 min (on and off sucking - sleepy at breast, 20 nipple shield)  LATCH Score/Interventions Latch: Repeated attempts needed to sustain latch, nipple held in mouth throughout feeding, stimulation needed to elicit sucking reflex. Intervention(s): Skin to skin;Teach feeding cues;Waking techniques Intervention(s): Adjust position;Assist with latch;Breast massage;Breast compression  Audible Swallowing: A few with stimulation Intervention(s): Hand expression  Type of Nipple: Flat (mom still edematous, with semi flat nipples, pre pumping to evert) Intervention(s): Shells;Hand pump;Double electric pump  Comfort (Breast/Nipple): Filling, red/small blisters or bruises, mild/mod discomfort (left nipple red, tender)  Problem noted: Mild/Moderate discomfort Interventions (Mild/moderate discomfort): Hand expression;Pre-pump if needed;Post-pump  Hold (Positioning): Assistance needed to correctly position infant at breast and maintain latch. Intervention(s): Breastfeeding basics reviewed;Support  Pillows;Position options;Skin to skin  LATCH Score: 5  Lactation Tools Discussed/Used Tools: Nipple Dorris CarnesShields;Shells;Pump Nipple shield size: 20 Breast pump type: Double-Electric Breast Pump   Consult Status Consult Status: Follow-up Date: 06/30/13 Follow-up type: In-patient    Sharon [name] 06/29/2013, 11:12 AM

## 2013-06-30 NOTE — Lactation Note (Signed)
This note was copied from the chart of Girl Rollen SoxMary Jozwiak. Lactation Consultation Note    Follow up consult with this mom and baby, now 52 hours post partum. We were going to do a pre and post weight, with baby at breast with nipple shield, but she was too sleepy and or agitated to suck. I noted an upper lip tie, down to her gum line, but lip easily extended up to nose. Lingula frenulum is mid tongue, somewhat short. Baby has good tongue motion, but with finger sucking, i did not feel her tongue under my finger, just gums. I had laura Lowell GuitarPowell, RN, IBCLC, come and she confirmed what I saw. Mom was told to talk to her pediatrician about possible effects this will have on breast feeding. i told mom it may not be an issue at all. Part of the problem with getting a deep latch, is mom's short, button nipples, and very dense, firm breast tissue.   I had mom pump her breast, and she expressed close to 60 mls of transitional milk. Mom was thrilled. The discharge plan is for mom to pump every 8-12 times a day, and try latching baby with cues, either with or without nipple shield, and then to bottle feed EBM , amounts according to feeding chart, with baby's cues. Mom has an outpatient lactation appointment for Monday, 5/25, at 9 am.   Patient Name: Girl Rollen SoxMary Langi UJWJX'BToday's Date: 06/30/2013 Reason for consult: Follow-up assessment   Maternal Data    Feeding Feeding Type: Breast Fed Length of feed: 15 min  LATCH Score/Interventions Latch: Too sleepy or reluctant, no latch achieved, no sucking elicited. (latched with 20 nipple shield, but baby would not suckle) Intervention(s): Teach feeding cues Intervention(s): Adjust position;Assist with latch;Breast compression  Audible Swallowing: A few with stimulation (EBM )  Type of Nipple: Flat (small botton nipples with no shaft) Intervention(s): Hand pump;Double electric pump  Comfort (Breast/Nipple): Filling, red/small blisters or bruises, mild/mod  discomfort  Problem noted: Filling Interventions (Filling): Double electric pump  Hold (Positioning): Assistance needed to correctly position infant at breast and maintain latch. Intervention(s): Breastfeeding basics reviewed;Support Pillows;Position options;Skin to skin  LATCH Score: 5  Lactation Tools Discussed/Used Breast pump type: Double-Electric Breast Pump Pump Review: Setup, frequency, and cleaning;Milk Storage   Consult Status Consult Status: Follow-up Date: 07/04/13 (9 am ) Follow-up type: Out-patient    Alfred LevinsChristine Anne Onnie Alatorre 06/30/2013, 2:43 PM

## 2013-06-30 NOTE — Discharge Summary (Signed)
Obstetric Discharge Summary Reason for Admission: onset of labor Prenatal Procedures: ultrasound Intrapartum Procedures: spontaneous vaginal delivery Postpartum Procedures: none Complications-Operative and Postpartum: 1 degree perineal laceration Hemoglobin  Date Value Ref Range Status  06/29/2013 11.8* 12.0 - 15.0 g/dL Final     HCT  Date Value Ref Range Status  06/29/2013 34.2* 36.0 - 46.0 % Final    Physical Exam:  General: alert and cooperative Lochia: appropriate Uterine Fundus: firm Incision: perineum intact DVT Evaluation: No evidence of DVT seen on physical exam. Negative Homan's sign. No cords or calf tenderness. No significant calf/ankle edema.  Discharge Diagnoses: Term Pregnancy-delivered  Discharge Information: Date: 06/30/2013 Activity: pelvic rest Diet: routine Medications: PNV and Ibuprofen Condition: stable Instructions: refer to practice specific booklet Discharge to: home   Newborn Data: Live born female  Birth Weight: 7 lb 3 oz (3260 g) APGAR: 4, 8  Home with mother.  Sharon [name] 06/30/2013, 8:17 AM

## 2013-06-30 NOTE — Lactation Note (Signed)
This note was copied from the chart of Sharon Rollen SoxMary Passmore. Lactation Consultation Note Follow up consult with this mom of a term baby, who latched with nipple shield, and fed 5 mls of EBM placed in shield with curved tip syringe. Mom to call for pre and post weight with baby.  Patient Name: Sharon [name] VQQVZ'DToday's Date: 06/30/2013 Reason for consult: Follow-up assessment   Maternal Data    Feeding Feeding Type: Breast Fed Length of feed: 15 min  LATCH Score/Interventions Latch: Repeated attempts needed to sustain latch, nipple held in mouth throughout feeding, stimulation needed to elicit sucking reflex. (20 nipple shiled primed with EBM) Intervention(s): Teach feeding cues Intervention(s): Adjust position;Assist with latch;Breast compression  Audible Swallowing: A few with stimulation (EBM )  Type of Nipple: Flat (small botton nipples with no shaft) Intervention(s): Hand pump;Double electric pump  Comfort (Breast/Nipple): Filling, red/small blisters or bruises, mild/mod discomfort  Problem noted: Filling Interventions (Filling): Double electric pump  Hold (Positioning): Assistance needed to correctly position infant at breast and maintain latch. Intervention(s): Breastfeeding basics reviewed;Support Pillows;Position options;Skin to skin  LATCH Score: 5  Lactation Tools Discussed/Used     Consult Status Consult Status: Follow-up Date: 06/30/13 Follow-up type: In-patient    Alfred LevinsChristine Anne Ashira Kirsten 06/30/2013, 2:38 PM

## 2013-06-30 NOTE — Lactation Note (Signed)
This note was copied from the chart of Sharon [name]. Lactation Consultation Note      Follow up consult with this mom of a term baby, now 48 hours post partum, and transitioning into mature milk. Mom has button nipples, and baby does better with nipple shield at breast. Mom is also pumping and cup/spoon feeing baby EBM Mom advised to call for follow up o/p consult, once home and settled. I also asked mom to pump now, and call for me to show her how to do SNS with nipple shiied, with curved tip syringe, with baby's next feeding. Mom agreeable to this. On exam, mom's breast are full,with easily expressed milk.  Patient Name: Sharon Rollen SoxMary Amenta ZOXWR'UToday's Date: 06/30/2013 Reason for consult: Follow-up assessment   Maternal Data    Feeding    LATCH Score/Interventions                      Lactation Tools Discussed/Used     Consult Status Consult Status: Follow-up Date: 06/30/13 Follow-up type: In-patient    Alfred LevinsChristine Anne Tiffinie Caillier 06/30/2013, 9:41 AM

## 2013-07-04 ENCOUNTER — Ambulatory Visit: Payer: Self-pay

## 2013-07-04 NOTE — Lactation Note (Signed)
This note was copied from the chart of Sharon [name]. Adult Lactation Consultation Outpatient Visit Note; Mom here today because she has been using a NS to latch Sharon  Patient Name: Sharon [name] Date of Birth: 06/28/2011 Gestational Age at Delivery: Unknown Type of Delivery:   Breastfeeding History: Frequency of Breastfeeding: q3-4 Length of Feeding: 20-45 min Voids: QS Stools: QS- changing to yellow stools since yesterday  Supplementing / Method: Pumping:  Type of Pump: Medela PIS   Frequency: after every feeding  Volume:  30-50  Comments: Has been hand expressing after pumping and obtains about 15 cc's more. Mom was bottle feeding EBM after each feeding but has not given a bottle since yesterday at noon, She has been leaving Sharon on the breast longer.     Consultation Evaluation:  Initial Feeding Assessment: Pre-feed Weight: 6- 14.9  3144g Post-feed Weight: 6- 15.9  3172g Amount Transferred: 28  Comments: Attempted to latch Sharon without the NS. She was too fussy. Used the #20 NS and she nursed for 20 minutes. Lots of swallows noted and whitish milk noted in NS  Additional Feeding Assessment: Pre-feed Weight: 6- 15.9  3172g Post-feed Weight: 7- 0.5  3188g Amount Transferred: 16 Comments: Sharon did latch and nnurse on and off for about 10 minutes. Mom pleased with progress. She did get fussy and we did use NS and she nursed another 10 minutes. Needed some stimulation  to continue nursing. Off to sleep    Total Breast milk Transferred this Visit: 44 cc's Total Supplement Given: 0  Additional Interventions: Encouraged to continue putting Sharon to breast without the NS when she is not real hungry. Can continue skin to skin and letting her nuzzle at the breast. Continue pumping to promote a good milk suply.  Follow-Up  Offered another OP appointment here but mom wants to go home and will call if she feels she needs another appointment. Suggested [facility] as resource for weight check and  support. To see Ped on June 8 To call prn    Pamelia Hoit 07/04/2013, 10:04 AM

## 2013-08-17 ENCOUNTER — Other Ambulatory Visit: Payer: Self-pay | Admitting: Obstetrics & Gynecology

## 2013-08-17 DIAGNOSIS — N63 Unspecified lump in unspecified breast: Secondary | ICD-10-CM

## 2013-08-23 ENCOUNTER — Other Ambulatory Visit: Payer: 59

## 2013-12-12 ENCOUNTER — Ambulatory Visit (INDEPENDENT_AMBULATORY_CARE_PROVIDER_SITE_OTHER): Payer: 59 | Admitting: Medical

## 2013-12-12 ENCOUNTER — Encounter: Payer: Self-pay | Admitting: Medical

## 2013-12-12 VITALS — BP 92/60 | HR 62 | Temp 98.0°F | Resp 16 | Wt 150.0 lb

## 2013-12-12 DIAGNOSIS — G4700 Insomnia, unspecified: Secondary | ICD-10-CM

## 2013-12-12 DIAGNOSIS — Z23 Encounter for immunization: Secondary | ICD-10-CM

## 2013-12-12 DIAGNOSIS — R208 Other disturbances of skin sensation: Secondary | ICD-10-CM

## 2013-12-12 DIAGNOSIS — G44019 Episodic cluster headache, not intractable: Secondary | ICD-10-CM

## 2013-12-12 DIAGNOSIS — R519 Headache, unspecified: Secondary | ICD-10-CM

## 2013-12-12 DIAGNOSIS — R51 Headache: Secondary | ICD-10-CM

## 2013-12-12 DIAGNOSIS — Z566 Other physical and mental strain related to work: Secondary | ICD-10-CM

## 2013-12-12 DIAGNOSIS — R200 Anesthesia of skin: Secondary | ICD-10-CM
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
## 2014-10-06 ENCOUNTER — Other Ambulatory Visit: Payer: Self-pay | Admitting: Obstetrics & Gynecology

## 2014-10-09 LAB — CYTOLOGY - PAP

## 2014-10-23 IMAGING — US US BREAST*R*
1 series · 7 of 7 positions shown · non-contrast
Comparison: 08/18/2011 on the left.

CLINICAL DATA: The patient was asked to return for short interval
follow-up ultrasound of probably benign fibroadenomas 6 months
after core needle biopsy of a fibroadenoma in the right breast
dated 08/18/2011.  She did not do so.  Her physician feels lumps in
the right breast.  The patient is now pregnant.  She declines to
tell me her gestational age and states that she is due "sometime in
June 2013." The patient's mother and sister have had breast cancer.
Her sister tested positive for "a breast cancer gene" but is not
MQXYH or FH3W6 positive per patient. She states that her mother has
not yet been tested.

BILATERAL BREAST ULTRASOUND

[Series 2: breast · 7 of 7 slices shown]
[im 1/7]
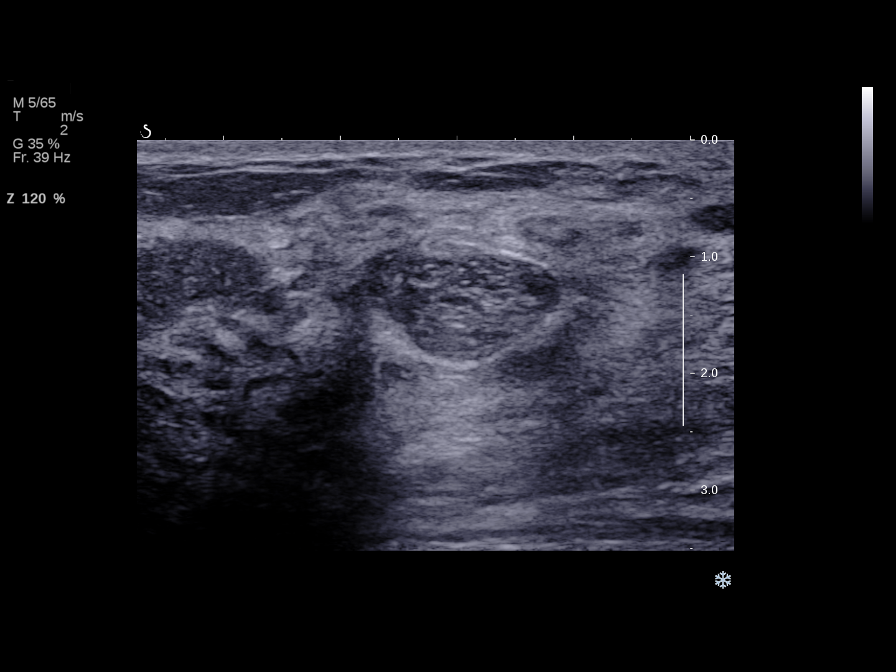
[im 2/7]
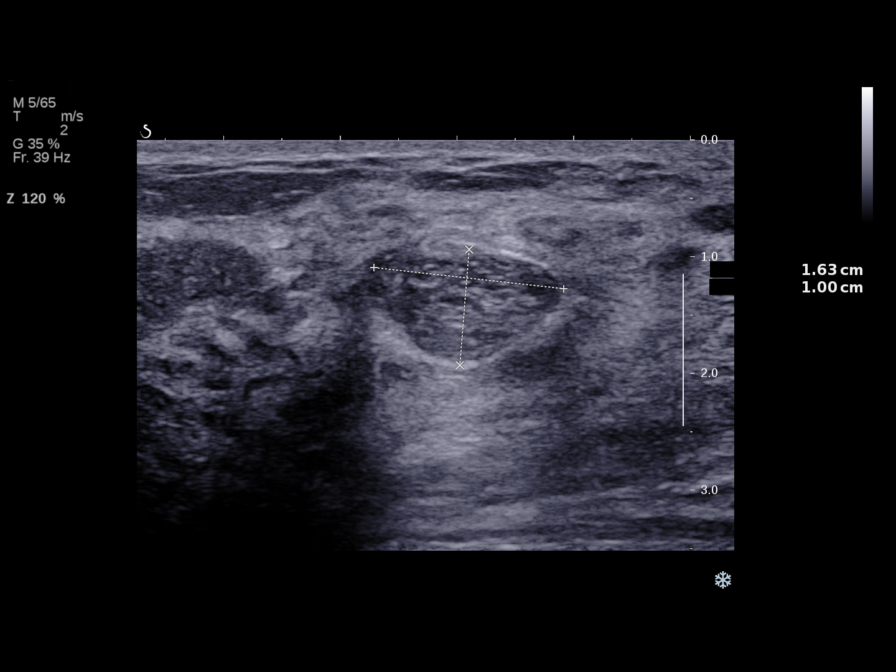
[im 3/7]
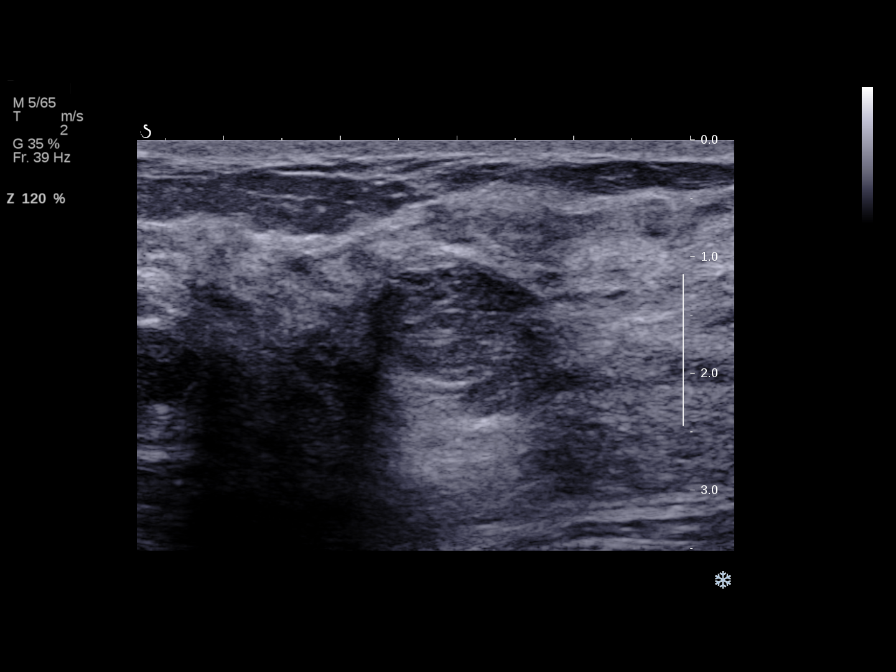
[im 4/7]
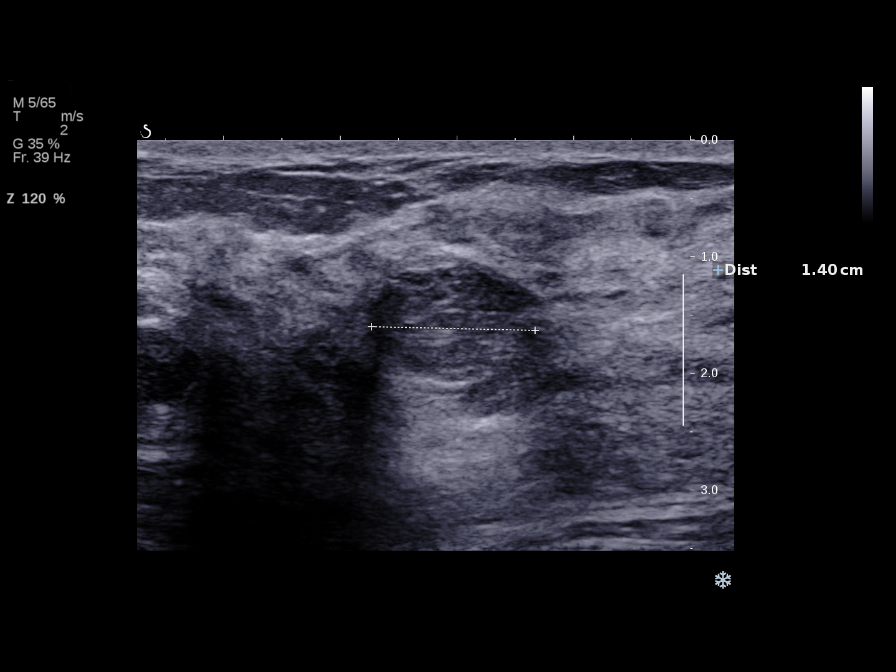
[im 5/7]
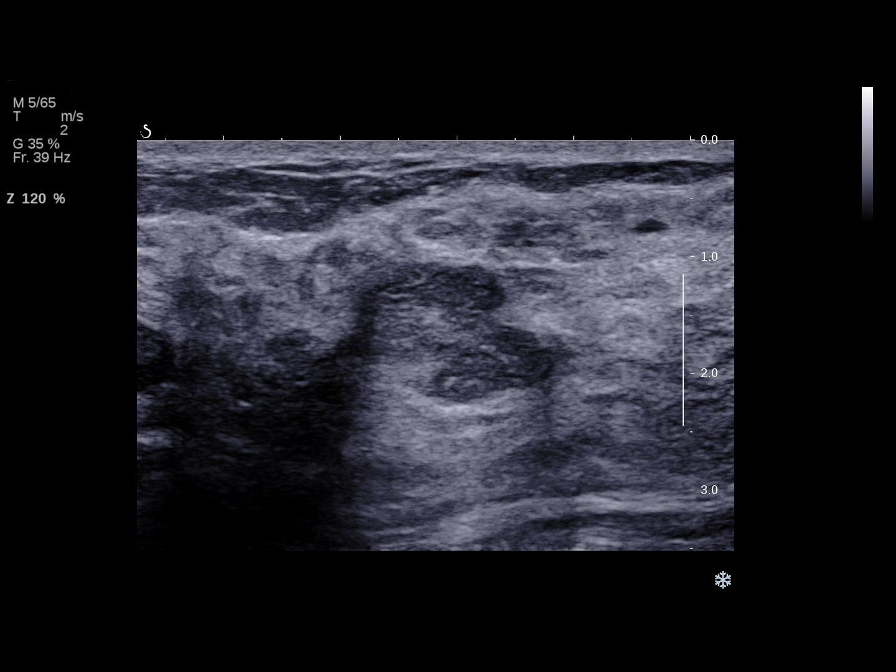
[im 6/7]
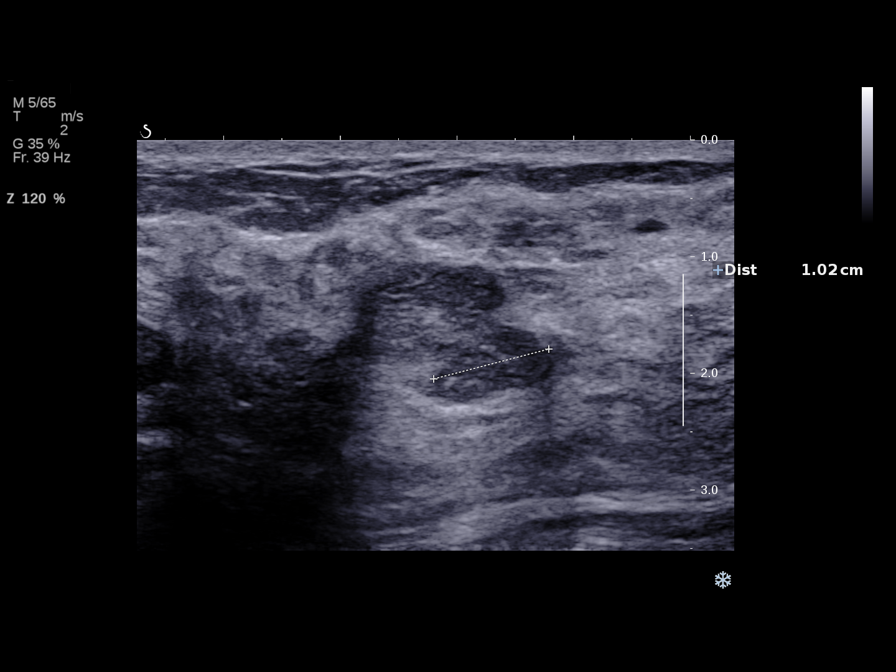
[im 7/7]
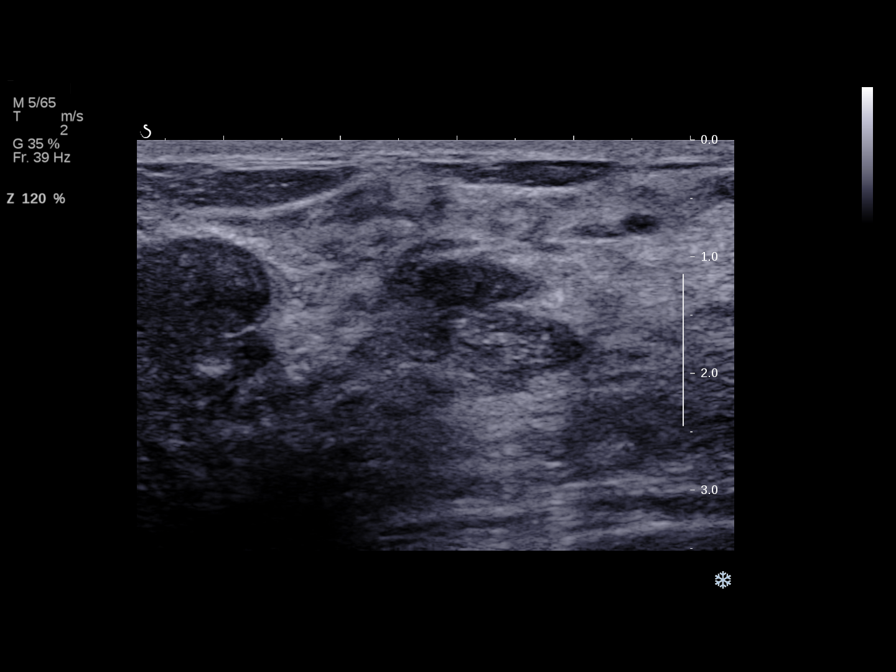

[7 of 7 positions shown; findings below may reference images not displayed]

07/28/2011 on the right.

On physical exam, there is diffuse lumpiness in the outer half of
the right breast.  No mass is palpated on the left.
FINDINGS: Ultrasound is performed, showing two adjacent oval
horizontally oriented well-defined homogeneous solid masses at 9
o'clock. The mass at 9 o'clock 5 cm from the right nipple measures
2.1 x 1.3 x 2.0 cm.  The mass at 9 o'clock 6 cm from the right
nipple measures 1.7 x 2.0 x 1.3 cm.  There is a similar mass at 10
o'clock 6 cm from the right nipple measuring 1.6 x 1.0 x 2.1 cm.
There are two adjacent masses at 10 o'clock 3 cm from the right
nipple measuring 1.6 x 1.0 x 1.4 cm and 1.0 x 0.7 x 1.3 cm.  On the
left, there is an oval horizontally oriented well-defined mass at 8
o'clock 7 cm from the nipple measuring 1.0 x 0.4 x 1.0 cm.

All of these masses have characteristics of benign fibroadenoma.
There is no significant change from prior study.
IMPRESSION: Stable benign masses in each breast likely representing benign
fibroadenoma.

RECOMMENDATION:
Suggest bilateral breast ultrasound in 12 months to complete 2
years of follow-up. If the patient has stopped breast-feeding at
the time she is due for breast ultrasound, bilateral mammography
would be suggested as well.

If the patient's lifetime risk of breast cancer is greater than
20%, yearly screening MRI would be suggested.

I have discussed the findings and recommendations with the patient.
Results were also provided in writing at the conclusion of the
visit.  If applicable, a reminder letter will be sent to the
patient regarding the next appointment.

BI-RADS CATEGORY 3:  Probably benign finding(s) - short interval
follow-up suggested.

## 2014-10-25 ENCOUNTER — Other Ambulatory Visit: Payer: Self-pay | Admitting: Obstetrics & Gynecology

## 2014-10-25 DIAGNOSIS — N6310 Unspecified lump in the right breast, unspecified quadrant: Secondary | ICD-10-CM

## 2014-10-30 ENCOUNTER — Other Ambulatory Visit: Payer: 59

## 2014-11-02 ENCOUNTER — Ambulatory Visit
Admission: RE | Admit: 2014-11-02 | Discharge: 2014-11-02 | Disposition: A | Discharge status: Home / Self Care | Payer: 59 | Source: Ambulatory Visit | Attending: Obstetrics & Gynecology | Admitting: Obstetrics & Gynecology

## 2014-11-02 DIAGNOSIS — N6310 Unspecified lump in the right breast, unspecified quadrant: Secondary | ICD-10-CM

## 2014-11-09 ENCOUNTER — Telehealth: Payer: Self-pay | Admitting: Genetic Counselor

## 2014-11-09 NOTE — Telephone Encounter (Signed)
genetic appt-s/w patient and gave appt for 10/13 @ 1 w/Kayla Boggs

## 2014-11-23 ENCOUNTER — Encounter: Payer: 59 | Admitting: Genetic Counselor

## 2014-11-23 ENCOUNTER — Other Ambulatory Visit: Payer: 59

## 2015-05-14 DIAGNOSIS — F341 Dysthymic disorder: Secondary | ICD-10-CM | POA: Diagnosis not present

## 2015-06-04 DIAGNOSIS — F341 Dysthymic disorder: Secondary | ICD-10-CM | POA: Diagnosis not present

## 2015-06-18 DIAGNOSIS — F341 Dysthymic disorder: Secondary | ICD-10-CM | POA: Diagnosis not present

## 2015-07-02 DIAGNOSIS — F341 Dysthymic disorder: Secondary | ICD-10-CM | POA: Diagnosis not present

## 2015-07-13 ENCOUNTER — Encounter (HOSPITAL_COMMUNITY): Payer: Self-pay | Admitting: Emergency Medicine

## 2015-07-13 ENCOUNTER — Ambulatory Visit (HOSPITAL_COMMUNITY)
Admission: EM | Admit: 2015-07-13 | Discharge: 2015-07-13 | Disposition: A | Discharge status: Home / Self Care | Payer: BLUE CROSS/BLUE SHIELD | Attending: Emergency Medicine | Admitting: Emergency Medicine

## 2015-07-13 DIAGNOSIS — J029 Acute pharyngitis, unspecified: Secondary | ICD-10-CM | POA: Diagnosis not present

## 2015-07-13 LAB — POCT RAPID STREP A: Streptococcus, Group A Screen (Direct): NEGATIVE

## 2015-07-13 NOTE — Discharge Instructions (Signed)
Pharyngitis Pharyngitis is redness, pain, and swelling (inflammation) of your pharynx.  CAUSES  Pharyngitis is usually caused by infection. Most of the time, these infections are from viruses (viral) and are part of a cold. However, sometimes pharyngitis is caused by bacteria (bacterial). Pharyngitis can also be caused by allergies. Viral pharyngitis may be spread from person to person by coughing, sneezing, and personal items or utensils (cups, forks, spoons, toothbrushes). Bacterial pharyngitis may be spread from person to person by more intimate contact, such as kissing.  SIGNS AND SYMPTOMS  Symptoms of pharyngitis include:   Sore throat.   Tiredness (fatigue).   Low-grade fever.   Headache.  Joint pain and muscle aches.  Skin rashes.  Swollen lymph nodes.  Plaque-like film on throat or tonsils (often seen with bacterial pharyngitis). DIAGNOSIS  Your health care provider will ask you questions about your illness and your symptoms. Your medical history, along with a physical exam, is often all that is needed to diagnose pharyngitis. Sometimes, a rapid strep test is done. Other lab tests may also be done, depending on the suspected cause.  TREATMENT  Viral pharyngitis will usually get better in 3-4 days without the use of medicine. Bacterial pharyngitis is treated with medicines that kill germs (antibiotics).  HOME CARE INSTRUCTIONS   Drink enough water and fluids to keep your urine clear or pale yellow.   Only take over-the-counter or prescription medicines as directed by your health care provider:   If you are prescribed antibiotics, make sure you finish them even if you start to feel better.   Do not take aspirin.   Get lots of rest.   Gargle with 8 oz of salt water ( tsp of salt per 1 qt of water) as often as every 1-2 hours to soothe your throat.   Throat lozenges (if you are not at risk for choking) or sprays may be used to soothe your throat. SEEK MEDICAL  CARE IF:   You have large, tender lumps in your neck.  You have a rash.  You cough up green, yellow-brown, or bloody spit. SEEK IMMEDIATE MEDICAL CARE IF:   Your neck becomes stiff.  You drool or are unable to swallow liquids.  You vomit or are unable to keep medicines or liquids down.  You have severe pain that does not go away with the use of recommended medicines.  You have trouble breathing (not caused by a stuffy nose). MAKE SURE YOU:   Understand these instructions.  Will watch your condition.  Will get help right away if you are not doing well or get worse.   This information is not intended to replace advice given to you by your health care provider. Make sure you discuss any questions you have with your health care provider.   Document Released: 01/27/2005 Document Revised: 11/17/2012 Document Reviewed: 10/04/2012 Elsevier Interactive Patient Education 2016 [employer]  Sore Throat For sore throat pain you may take ibuprofen 400 600 mg every 6 hours. Cepacol lozenges frequently Drink plenty of cool liquids and stay well-hydrated For any sense of drainage recommend taking something like Claritin or Zyrtec as needed. A sore throat is a painful, burning, sore, or scratchy feeling of the throat. There may be pain or tenderness when swallowing or talking. You may have other symptoms with a sore throat. These include coughing, sneezing, fever, or a swollen neck. A sore throat is often the first sign of another sickness. These sicknesses may include a cold, flu, strep throat, or  an infection called mono. Most sore throats go away without medical treatment.  HOME CARE   Only take medicine as told by your doctor.  Drink enough fluids to keep your pee (urine) clear or pale yellow.  Rest as needed.  Try using throat sprays, lozenges, or suck on hard candy (if older than 4 years or as told).  Sip warm liquids, such as broth, herbal tea, or warm water with honey. Try  sucking on frozen ice pops or drinking cold liquids.  Rinse the mouth (gargle) with salt water. Mix 1 teaspoon salt with 8 ounces of water.  Do not smoke. Avoid being around others when they are smoking.  Put a humidifier in your bedroom at night to moisten the air. You can also turn on a hot shower and sit in the bathroom for 5-10 minutes. Be sure the bathroom door is closed. GET HELP RIGHT AWAY IF:   You have trouble breathing.  You cannot swallow fluids, soft foods, or your spit (saliva).  You have more puffiness (swelling) in the throat.  Your sore throat does not get better in 7 days.  You feel sick to your stomach (nauseous) and throw up (vomit).  You have a fever or lasting symptoms for more than 2-3 days.  You have a fever and your symptoms suddenly get worse. MAKE SURE YOU:   Understand these instructions.  Will watch your condition.  Will get help right away if you are not doing well or get worse.   This information is not intended to replace advice given to you by your health care provider. Make sure you discuss any questions you have with your health care provider.   Document Released: 11/06/2007 Document Revised: 10/22/2011 Document Reviewed: 10/05/2011 Elsevier Interactive Patient Education [employer]

## 2015-07-13 NOTE — ED Notes (Signed)
Pt d/c by David M, NP 
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
## 2015-07-13 NOTE — ED Notes (Signed)
Pt here for possible strep onset 5/28... Sx include: ST, BA, chills and odynophagia... Denies fevers... A&O x4... No acute distress.

## 2015-07-15 LAB — CULTURE, GROUP A STREP (THRC)

## 2015-07-23 DIAGNOSIS — F341 Dysthymic disorder: Secondary | ICD-10-CM | POA: Diagnosis not present

## 2015-07-30 DIAGNOSIS — F341 Dysthymic disorder: Secondary | ICD-10-CM | POA: Diagnosis not present

## 2015-08-09 DIAGNOSIS — Z3A01 Less than 8 weeks gestation of pregnancy: Secondary | ICD-10-CM | POA: Diagnosis not present

## 2015-08-09 DIAGNOSIS — Z3201 Encounter for pregnancy test, result positive: Secondary | ICD-10-CM | POA: Diagnosis not present

## 2015-08-09 DIAGNOSIS — O26851 Spotting complicating pregnancy, first trimester: Secondary | ICD-10-CM | POA: Diagnosis not present

## 2015-08-16 DIAGNOSIS — Z3201 Encounter for pregnancy test, result positive: Secondary | ICD-10-CM | POA: Diagnosis not present

## 2015-08-20 DIAGNOSIS — F341 Dysthymic disorder: Secondary | ICD-10-CM | POA: Diagnosis not present

## 2015-08-24 DIAGNOSIS — Z3491 Encounter for supervision of normal pregnancy, unspecified, first trimester: Secondary | ICD-10-CM | POA: Diagnosis not present

## 2015-08-24 DIAGNOSIS — Z36 Encounter for antenatal screening of mother: Secondary | ICD-10-CM | POA: Diagnosis not present

## 2015-08-24 DIAGNOSIS — Z3481 Encounter for supervision of other normal pregnancy, first trimester: Secondary | ICD-10-CM | POA: Diagnosis not present

## 2015-09-07 DIAGNOSIS — Z36 Encounter for antenatal screening of mother: Secondary | ICD-10-CM | POA: Diagnosis not present

## 2015-09-10 DIAGNOSIS — F341 Dysthymic disorder: Secondary | ICD-10-CM | POA: Diagnosis not present

## 2015-09-24 DIAGNOSIS — F341 Dysthymic disorder: Secondary | ICD-10-CM | POA: Diagnosis not present

## 2015-10-04 DIAGNOSIS — Z36 Encounter for antenatal screening of mother: Secondary | ICD-10-CM | POA: Diagnosis not present

## 2015-10-04 DIAGNOSIS — Z3481 Encounter for supervision of other normal pregnancy, first trimester: Secondary | ICD-10-CM | POA: Diagnosis not present

## 2015-10-04 DIAGNOSIS — Z113 Encounter for screening for infections with a predominantly sexual mode of transmission: Secondary | ICD-10-CM | POA: Diagnosis not present

## 2015-10-22 DIAGNOSIS — F341 Dysthymic disorder: Secondary | ICD-10-CM | POA: Diagnosis not present

## 2015-11-01 DIAGNOSIS — Z23 Encounter for immunization: Secondary | ICD-10-CM | POA: Diagnosis not present

## 2015-11-01 DIAGNOSIS — Z36 Encounter for antenatal screening of mother: Secondary | ICD-10-CM | POA: Diagnosis not present

## 2015-11-14 DIAGNOSIS — Z362 Encounter for other antenatal screening follow-up: Secondary | ICD-10-CM | POA: Diagnosis not present

## 2015-11-21 DIAGNOSIS — F341 Dysthymic disorder: Secondary | ICD-10-CM | POA: Diagnosis not present

## 2015-12-05 DIAGNOSIS — F341 Dysthymic disorder: Secondary | ICD-10-CM | POA: Diagnosis not present

## 2015-12-19 DIAGNOSIS — F341 Dysthymic disorder: Secondary | ICD-10-CM | POA: Diagnosis not present

## 2015-12-26 DIAGNOSIS — Z3689 Encounter for other specified antenatal screening: Secondary | ICD-10-CM | POA: Diagnosis not present

## 2015-12-26 DIAGNOSIS — Z3482 Encounter for supervision of other normal pregnancy, second trimester: Secondary | ICD-10-CM | POA: Diagnosis not present

## 2016-01-02 DIAGNOSIS — Z3A28 28 weeks gestation of pregnancy: Secondary | ICD-10-CM | POA: Diagnosis not present

## 2016-01-02 DIAGNOSIS — O99810 Abnormal glucose complicating pregnancy: Secondary | ICD-10-CM | POA: Diagnosis not present

## 2016-01-02 DIAGNOSIS — Z3689 Encounter for other specified antenatal screening: Secondary | ICD-10-CM | POA: Diagnosis not present

## 2016-01-09 DIAGNOSIS — F341 Dysthymic disorder: Secondary | ICD-10-CM | POA: Diagnosis not present

## 2016-01-16 ENCOUNTER — Encounter: Payer: BLUE CROSS/BLUE SHIELD | Attending: Medical | Admitting: Skilled Nursing Facility1

## 2016-01-16 ENCOUNTER — Encounter: Payer: Self-pay | Admitting: Skilled Nursing Facility1

## 2016-01-16 DIAGNOSIS — O99810 Abnormal glucose complicating pregnancy: Secondary | ICD-10-CM | POA: Diagnosis not present

## 2016-01-16 DIAGNOSIS — Z6826 Body mass index (BMI) 26.0-26.9, adult: Secondary | ICD-10-CM | POA: Diagnosis not present

## 2016-01-16 DIAGNOSIS — Z713 Dietary counseling and surveillance: Secondary | ICD-10-CM | POA: Insufficient documentation

## 2016-01-16 DIAGNOSIS — Z3A00 Weeks of gestation of pregnancy not specified: Secondary | ICD-10-CM | POA: Insufficient documentation

## 2016-01-16 NOTE — Patient Instructions (Signed)
-  Aim to eat every 3 hours   -Breakfast then snack then lunch then snack then dinner and maybe a bedtime snack  -To know how many carbs:   Weight: gain or loss  Energy   Glucose Numbers: Are they in range  Hunger/Fullness

## 2016-01-16 NOTE — Progress Notes (Signed)
Diabetes Self-Management Education  Visit Type: First/Initial  Appt. Start Time: 8:55  Appt. End Time: 10:29  01/16/2016  Ms. Sharon [name], identified by name and date of birth, is a 35 y.o. female with a diagnosis of Diabetes: Gestational Diabetes.   ASSESSMENT  Height 5\' 6"  (1.676 m), weight 163 lb 9.6 oz (74.2 kg), last menstrual period 06/21/2015. Body mass index is 26.41 kg/m. Pt states she is [redacted] weeks along in her pregnancy.Pt states snce diagnosis (november 22) she has lost wt: 170's due to cutting carbohydrate intake. Pt states she does not sleep well regularly. Pt states she had 2 episodes of feeling like she was going to passout this occurred before lunch with no snacks-shaky. Pt states she Runs for 30 minutes a few times a week. Pt was given contour next:[id number]: 09/18.With a fasting number of 80.       Diabetes Self-Management Education - 01/16/16 0859      Visit Information   Visit Type First/Initial     Initial Visit   Diabetes Type Gestational Diabetes   Are you currently following a meal plan? No   Are you taking your medications as prescribed? Not on Medications     Health Coping   How would you rate your overall health? Good     Psychosocial Assessment   Patient Belief/Attitude about Diabetes Afraid     Pre-Education Assessment   Patient understands the diabetes disease and treatment process. Needs Instruction   Patient understands incorporating nutritional management into lifestyle. Needs Instruction   Patient undertands incorporating physical activity into lifestyle. Needs Instruction   Patient understands using medications safely. Needs Instruction   Patient understands monitoring blood glucose, interpreting and using results Needs Instruction   Patient understands prevention, detection, and treatment of acute complications. Needs Instruction   Patient understands prevention, detection, and treatment of chronic complications. Needs Instruction    Patient understands how to develop strategies to address psychosocial issues. Needs Instruction   Patient understands how to develop strategies to promote health/change behavior. Needs Instruction     Complications   How often do you check your blood sugar? 0 times/day (not testing)   Have you had a dilated eye exam in the past 12 months? No   Have you had a dental exam in the past 12 months? Yes   Are you checking your feet? No     Dietary Intake   Breakfast greek yogurt with apples and almonds or pumpkin seed granola   Snack (morning) apple   Lunch canned shicken with mayo, whole wheat bread with appl or carrot sticks   Snack (afternoon) maybe a granola bar   Dinner meat, salad: green leafy, tomato, mushroom, onion, pepper, fet cheese or cesear salad; sometimes some bread OR vegetable with rice and asparagus   Beverage(s) water, coffee     Exercise   Exercise Type Moderate (swimming / aerobic walking)   How many days per week to you exercise? 2   How many minutes per day do you exercise? 60   Total minutes per week of exercise 120     Patient Education   Previous Diabetes Education No   Disease state  Factors that contribute to the development of diabetes   Nutrition management  Role of diet in the treatment of diabetes and the relationship between the three main macronutrients and blood glucose level;Food label reading, portion sizes and measuring food.;Carbohydrate counting;Reviewed blood glucose goals for pre and post meals and how to evaluate the patients' food  intake on their blood glucose level.;Information on hints to eating out and maintain blood glucose control.   Physical activity and exercise  Role of exercise on diabetes management, blood pressure control and cardiac health.;Identified with patient nutritional and/or medication changes necessary with exercise.   Monitoring Taught/evaluated SMBG meter.;Purpose and frequency of SMBG.;Yearly dilated eye exam;Daily foot  exams;Identified appropriate SMBG and/or A1C goals.   Acute complications Taught treatment of hypoglycemia - the 15 rule.;Discussed and identified patients' treatment of hyperglycemia.   Preconception care Reviewed with patient blood glucose goals with pregnancy     Individualized Goals (developed by patient)   Nutrition Follow meal plan discussed;Adjust meds/carbs with exercise as discussed   Physical Activity Exercise 1-2 times per week;30 minutes per day   Monitoring  test my blood glucose as discussed;test blood glucose pre and post meals as discussed     Post-Education Assessment   Patient understands the diabetes disease and treatment process. Demonstrates understanding / competency   Patient understands incorporating nutritional management into lifestyle. Demonstrates understanding / competency   Patient undertands incorporating physical activity into lifestyle. Demonstrates understanding / competency   Patient understands using medications safely. Demonstrates understanding / competency   Patient understands monitoring blood glucose, interpreting and using results Demonstrates understanding / competency   Patient understands prevention, detection, and treatment of acute complications. Demonstrates understanding / competency   Patient understands prevention, detection, and treatment of chronic complications. Demonstrates understanding / competency   Patient understands how to develop strategies to address psychosocial issues. Demonstrates understanding / competency   Patient understands how to develop strategies to promote health/change behavior. Demonstrates understanding / competency     Outcomes   Expected Outcomes Demonstrated interest in learning. Expect positive outcomes   Future DMSE PRN   Program Status Completed      Individualized Plan for Diabetes Self-Management Training:   Learning Objective:  Patient will have a greater understanding of diabetes  self-management. Patient education plan is to attend individual and/or group sessions per assessed needs and concerns.   Plan:   Patient Instructions  -Aim to eat every 3 hours   -Breakfast then snack then lunch then snack then dinner and maybe a bedtime snack  -To know how many carbs:   Weight: gain or loss  Energy   Glucose Numbers: Are they in range  Hunger/Fullness         Expected Outcomes:  Demonstrated interest in learning. Expect positive outcomes  Education material provided: Meal plan card, My Plate, Snack sheet and Carbohydrate counting sheet and gestational DM handouts.   If problems or questions, patient to contact team via:  Phone  Future DSME appointment: PRN

## 2016-02-11 NOTE — L&D Delivery Note (Signed)
Delivery Note At 9:49 AM a viable and healthy female was delivered via Vaginal, Spontaneous Delivery (Presentation: LOA  ).  APGAR: 8, 9; weight pending .   Placenta status: spontaneous, intact.  Cord:  with the following complications: Barada x one reduced on perineum.  Cord pH: na  Anesthesia:  local Episiotomy: None Lacerations: 2nd degree;Perineal Suture Repair: 2.0 vicryl rapide Est. Blood Loss (mL): 150  Mom to postpartum.  Baby to Couplet care / Skin to Skin.  Norell Brisbin J 03/15/2016, 10:15 AM

## 2016-02-13 DIAGNOSIS — O24913 Unspecified diabetes mellitus in pregnancy, third trimester: Secondary | ICD-10-CM | POA: Diagnosis not present

## 2016-02-13 DIAGNOSIS — Z3A34 34 weeks gestation of pregnancy: Secondary | ICD-10-CM | POA: Diagnosis not present

## 2016-02-18 DIAGNOSIS — Z3685 Encounter for antenatal screening for Streptococcus B: Secondary | ICD-10-CM | POA: Diagnosis not present

## 2016-02-20 DIAGNOSIS — Z3482 Encounter for supervision of other normal pregnancy, second trimester: Secondary | ICD-10-CM | POA: Diagnosis not present

## 2016-02-20 DIAGNOSIS — Z3483 Encounter for supervision of other normal pregnancy, third trimester: Secondary | ICD-10-CM | POA: Diagnosis not present

## 2016-03-14 DIAGNOSIS — Z3A39 39 weeks gestation of pregnancy: Secondary | ICD-10-CM | POA: Diagnosis not present

## 2016-03-14 DIAGNOSIS — O24420 Gestational diabetes mellitus in childbirth, diet controlled: Secondary | ICD-10-CM | POA: Diagnosis not present

## 2016-03-15 ENCOUNTER — Inpatient Hospital Stay (HOSPITAL_COMMUNITY)
Admission: AD | Admit: 2016-03-15 | Discharge: 2016-03-16 | DRG: 775 | Disposition: A | Discharge status: Home / Self Care | Payer: BLUE CROSS/BLUE SHIELD | Source: Ambulatory Visit | Attending: Obstetrics and Gynecology | Admitting: Obstetrics and Gynecology

## 2016-03-15 ENCOUNTER — Encounter (HOSPITAL_COMMUNITY): Payer: Self-pay | Admitting: *Deleted

## 2016-03-15 DIAGNOSIS — Z3A39 39 weeks gestation of pregnancy: Secondary | ICD-10-CM | POA: Diagnosis not present

## 2016-03-15 DIAGNOSIS — O24420 Gestational diabetes mellitus in childbirth, diet controlled: Secondary | ICD-10-CM | POA: Diagnosis not present

## 2016-03-15 HISTORY — DX: Gestational diabetes mellitus in pregnancy, unspecified control: O24.419

## 2016-03-15 LAB — TYPE AND SCREEN
ABO/RH(D): A POS
Antibody Screen: NEGATIVE

## 2016-03-15 LAB — CBC
HCT: 40.1 % (ref 36.0–46.0)
Hemoglobin: 14 g/dL (ref 12.0–15.0)
MCH: 30.7 pg (ref 26.0–34.0)
MCHC: 34.9 g/dL (ref 30.0–36.0)
MCV: 87.9 fL (ref 78.0–100.0)
Platelets: 234 10*3/uL (ref 150–400)
RBC: 4.56 MIL/uL (ref 3.87–5.11)
RDW: 14 % (ref 11.5–15.5)
WBC: 13.3 10*3/uL — ABNORMAL HIGH (ref 4.0–10.5)

## 2016-03-15 LAB — ABO/RH: ABO/RH(D): A POS

## 2016-03-15 LAB — OB RESULTS CONSOLE GBS: GBS: NEGATIVE

## 2016-03-15 MED ORDER — ZOLPIDEM TARTRATE 5 MG PO TABS: 5.0000 mg | ORAL_TABLET | Freq: Every evening | ORAL | Status: DC | PRN

## 2016-03-15 MED ORDER — OXYCODONE-ACETAMINOPHEN 5-325 MG PO TABS
2.0000 | ORAL_TABLET | ORAL | Status: DC | PRN
Start: 2016-03-15 — End: 2016-03-16

## 2016-03-15 MED ORDER — BENZOCAINE-MENTHOL 20-0.5 % EX AERO
1.0000 "application " | INHALATION_SPRAY | CUTANEOUS | Status: DC | PRN
  Filled 2016-03-15: qty 56

## 2016-03-15 MED ORDER — ACETAMINOPHEN 325 MG PO TABS: 650.0000 mg | ORAL_TABLET | ORAL | Status: DC | PRN

## 2016-03-15 MED ORDER — DIBUCAINE 1 % RE OINT: 1.0000 "application " | TOPICAL_OINTMENT | RECTAL | Status: DC | PRN

## 2016-03-15 MED ORDER — ONDANSETRON HCL 4 MG/2ML IJ SOLN: 4.0000 mg | Freq: Four times a day (QID) | INTRAMUSCULAR | Status: DC | PRN

## 2016-03-15 MED ORDER — FLEET ENEMA 7-19 GM/118ML RE ENEM: 1.0000 | ENEMA | RECTAL | Status: DC | PRN

## 2016-03-15 MED ORDER — METHYLERGONOVINE MALEATE 0.2 MG PO TABS: 0.2000 mg | ORAL_TABLET | ORAL | Status: DC | PRN

## 2016-03-15 MED ORDER — OXYTOCIN 40 UNITS IN LACTATED RINGERS INFUSION - SIMPLE MED
2.5000 [IU]/h | INTRAVENOUS | Status: DC
  Filled 2016-03-15: qty 1000

## 2016-03-15 MED ORDER — SENNOSIDES-DOCUSATE SODIUM 8.6-50 MG PO TABS
2.0000 | ORAL_TABLET | ORAL | Status: DC
  Administered 2016-03-16: 2 via ORAL
  Filled 2016-03-15: qty 2

## 2016-03-15 MED ORDER — SOD CITRATE-CITRIC ACID 500-334 MG/5ML PO SOLN: 30.0000 mL | ORAL | Status: DC | PRN

## 2016-03-15 MED ORDER — DIPHENHYDRAMINE HCL 25 MG PO CAPS: 25.0000 mg | ORAL_CAPSULE | Freq: Four times a day (QID) | ORAL | Status: DC | PRN

## 2016-03-15 MED ORDER — ONDANSETRON HCL 4 MG PO TABS: 4.0000 mg | ORAL_TABLET | ORAL | Status: DC | PRN

## 2016-03-15 MED ORDER — WITCH HAZEL-GLYCERIN EX PADS: 1.0000 "application " | MEDICATED_PAD | CUTANEOUS | Status: DC | PRN

## 2016-03-15 MED ORDER — METHYLERGONOVINE MALEATE 0.2 MG/ML IJ SOLN: 0.2000 mg | INTRAMUSCULAR | Status: DC | PRN

## 2016-03-15 MED ORDER — COCONUT OIL OIL: 1.0000 "application " | TOPICAL_OIL | Status: DC | PRN

## 2016-03-15 MED ORDER — OXYCODONE-ACETAMINOPHEN 5-325 MG PO TABS
1.0000 | ORAL_TABLET | ORAL | Status: DC | PRN
Start: 2016-03-15 — End: 2016-03-16

## 2016-03-15 MED ORDER — LACTATED RINGERS IV SOLN: 500.0000 mL | INTRAVENOUS | Status: DC | PRN

## 2016-03-15 MED ORDER — IBUPROFEN 600 MG PO TABS
600.0000 mg | ORAL_TABLET | Freq: Four times a day (QID) | ORAL | Status: DC
  Administered 2016-03-15 – 2016-03-16 (×5): 600 mg via ORAL
  Filled 2016-03-15 (×5): qty 1

## 2016-03-15 MED ORDER — TETANUS-DIPHTH-ACELL PERTUSSIS 5-2.5-18.5 LF-MCG/0.5 IM SUSP
0.5000 mL | Freq: Once | INTRAMUSCULAR | Status: AC
  Administered 2016-03-16: 0.5 mL via INTRAMUSCULAR
  Filled 2016-03-15: qty 0.5

## 2016-03-15 MED ORDER — SIMETHICONE 80 MG PO CHEW: 80.0000 mg | CHEWABLE_TABLET | ORAL | Status: DC | PRN

## 2016-03-15 MED ORDER — FENTANYL CITRATE (PF) 100 MCG/2ML IJ SOLN: 50.0000 ug | INTRAMUSCULAR | Status: DC | PRN

## 2016-03-15 MED ORDER — ONDANSETRON HCL 4 MG/2ML IJ SOLN: 4.0000 mg | INTRAMUSCULAR | Status: DC | PRN

## 2016-03-15 MED ORDER — OXYTOCIN BOLUS FROM INFUSION
500.0000 mL | Freq: Once | INTRAVENOUS | Status: AC
  Administered 2016-03-15: 500 mL via INTRAVENOUS

## 2016-03-15 MED ORDER — LACTATED RINGERS IV SOLN
INTRAVENOUS | Status: DC
  Administered 2016-03-15: 09:00:00 via INTRAVENOUS

## 2016-03-15 MED ORDER — OXYCODONE-ACETAMINOPHEN 5-325 MG PO TABS: 1.0000 | ORAL_TABLET | ORAL | Status: DC | PRN

## 2016-03-15 MED ORDER — LIDOCAINE HCL (PF) 1 % IJ SOLN
30.0000 mL | INTRAMUSCULAR | Status: DC | PRN
  Administered 2016-03-15: 30 mL via SUBCUTANEOUS
  Filled 2016-03-15: qty 30

## 2016-03-15 MED ORDER — PRENATAL MULTIVITAMIN CH
1.0000 | ORAL_TABLET | Freq: Every day | ORAL | Status: DC
  Administered 2016-03-15 – 2016-03-16 (×2): 1 via ORAL
  Filled 2016-03-15 (×2): qty 1

## 2016-03-15 MED ORDER — OXYCODONE-ACETAMINOPHEN 5-325 MG PO TABS: 2.0000 | ORAL_TABLET | ORAL | Status: DC | PRN
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
## 2016-03-15 NOTE — MAU Note (Signed)
Contractions started @ 0427 this morning, having small amount of bleeding, no LOF.  Reports good FM.

## 2016-03-15 NOTE — Lactation Note (Signed)
This note was copied from a baby's chart. Lactation Consultation Note  Patient Name: Sharon [name] [id number]'s Date: 03/15/2016 Reason for consult: Initial assessment Breastfeeding consultation services and support information given and reviewed.  This is mom's second baby and newborn is 4 hours old.  Mom states baby has latched easily and fed well.  Instructed to watch for feeding cues and to feed with any cue.  Encouraged to call for concerns/assist.  Maternal Data Has patient been taught Hand Expression?: Yes Does the patient have breastfeeding experience prior to this delivery?: Yes  Feeding Feeding Type: Breast Fed Length of feed: 0 min  LATCH Score/Interventions                      Lactation Tools Discussed/Used     Consult Status Consult Status: Follow-up Date: 03/16/16 Follow-up type: In-patient    Huston FoleyMOULDEN, Yeraldy Spike S 03/15/2016, 2:40 PM

## 2016-03-16 LAB — CBC
HCT: 34.1 % — ABNORMAL LOW (ref 36.0–46.0)
Hemoglobin: 11.7 g/dL — ABNORMAL LOW (ref 12.0–15.0)
MCH: 30.6 pg (ref 26.0–34.0)
MCHC: 34.3 g/dL (ref 30.0–36.0)
MCV: 89.3 fL (ref 78.0–100.0)
Platelets: 221 10*3/uL (ref 150–400)
RBC: 3.82 MIL/uL — ABNORMAL LOW (ref 3.87–5.11)
RDW: 14.2 % (ref 11.5–15.5)
WBC: 11.3 10*3/uL — ABNORMAL HIGH (ref 4.0–10.5)

## 2016-03-16 LAB — RPR: RPR Ser Ql: NONREACTIVE

## 2016-03-16 MED ORDER — IBUPROFEN 600 MG PO TABS: 600.0000 mg | ORAL_TABLET | Freq: Four times a day (QID) | ORAL | 0 refills | Status: DC

## 2016-03-16 NOTE — Discharge Instructions (Signed)
Postpartum Depression and Baby Blues °The postpartum period begins right after the birth of a baby. During this time, there is often a great amount of joy and excitement. It is also a time of many changes in the life of the parents. Regardless of how many times a mother gives birth, each child brings new challenges and dynamics to the family. It is not unusual to have feelings of excitement along with confusing shifts in moods, emotions, and thoughts. All mothers are at risk of developing postpartum depression or the "baby blues." These mood changes can occur right after giving birth, or they may occur many months after giving birth. The baby blues or postpartum depression can be mild or severe. Additionally, postpartum depression can go away rather quickly, or it can be a long-term condition. °What are the causes? °Raised hormone levels and the rapid drop in those levels are thought to be a main cause of postpartum depression and the baby blues. A number of hormones change during and after pregnancy. Estrogen and progesterone usually decrease right after the delivery of your baby. The levels of thyroid hormone and various cortisol steroids also rapidly drop. Other factors that play a role in these mood changes include major life events and genetics. °What increases the risk? °If you have any of the following risks for the baby blues or postpartum depression, know what symptoms to watch out for during the postpartum period. Risk factors that may increase the likelihood of getting the baby blues or postpartum depression include: °· Having a personal or family history of depression. °· Having depression while being pregnant. °· Having premenstrual mood issues or mood issues related to oral contraceptives. °· Having a lot of life stress. °· Having marital conflict. °· Lacking a social support network. °· Having a baby with special needs. °· Having health problems, such as diabetes. °What are the signs or  symptoms? °Symptoms of baby blues include: °· Brief changes in mood, such as going from extreme happiness to sadness. °· Decreased concentration. °· Difficulty sleeping. °· Crying spells, tearfulness. °· Irritability. °· Anxiety. °Symptoms of postpartum depression typically begin within the first month after giving birth. These symptoms include: °· Difficulty sleeping or excessive sleepiness. °· Marked weight loss. °· Agitation. °· Feelings of worthlessness. °· Lack of interest in activity or food. °Postpartum psychosis is a very serious condition and can be dangerous. Fortunately, it is rare. Displaying any of the following symptoms is cause for immediate medical attention. Symptoms of postpartum psychosis include: °· Hallucinations and delusions. °· Bizarre or disorganized behavior. °· Confusion or disorientation. °How is this diagnosed? °A diagnosis is made by an evaluation of your symptoms. There are no medical or lab tests that lead to a diagnosis, but there are various questionnaires that a health care provider may use to identify those with the baby blues, postpartum depression, or psychosis. Often, a screening tool called the Edinburgh Postnatal Depression Scale is used to diagnose depression in the postpartum period. °How is this treated? °The baby blues usually goes away on its own in 1-2 weeks. Social support is often all that is needed. You will be encouraged to get adequate sleep and rest. Occasionally, you may be given medicines to help you sleep. °Postpartum depression requires treatment because it can last several months or longer if it is not treated. Treatment may include individual or group therapy, medicine, or both to address any social, physiological, and psychological factors that may play a role in the depression. Regular exercise, a   healthy diet, rest, and social support may also be strongly recommended. °Postpartum psychosis is more serious and needs treatment right away. Hospitalization is  often needed. °Follow these instructions at home: °· Get as much rest as you can. Nap when the baby sleeps. °· Exercise regularly. Some women find yoga and walking to be beneficial. °· Eat a balanced and nourishing diet. °· Do little things that you enjoy. Have a cup of tea, take a bubble bath, read your favorite magazine, or listen to your favorite music. °· Avoid alcohol. °· Ask for help with household chores, cooking, grocery shopping, or running errands as needed. Do not try to do everything. °· Talk to people close to you about how you are feeling. Get support from your partner, family members, friends, or other new moms. °· Try to stay positive in how you think. Think about the things you are grateful for. °· Do not spend a lot of time alone. °· Only take over-the-counter or prescription medicine as directed by your health care provider. °· Keep all your postpartum appointments. °· Let your health care provider know if you have any concerns. °Contact a health care provider if: °You are having a reaction to or problems with your medicine. °Get help right away if: °· You have suicidal feelings. °· You think you may harm the baby or someone else. °This information is not intended to replace advice given to you by your health care provider. Make sure you discuss any questions you have with your health care provider. °Document Released: 11/01/2003 Document Revised: 07/05/2015 Document Reviewed: 11/08/2012 °[employer]vier Interactive Patient Education © 2017 [employer] °Postpartum Care After Vaginal Delivery °The period of time right after you deliver your newborn is called the postpartum period. °What kind of medical care will I receive? °· You may continue to receive fluids and medicines through an IV tube inserted into one of your veins. °· If an incision was made near your vagina (episiotomy) or if you had some vaginal tearing during delivery, cold compresses may be placed on your episiotomy or your tear. This  helps to reduce pain and swelling. °· You may be given a squirt bottle to use when you go to the bathroom. You may use this until you are comfortable wiping as usual. To use the squirt bottle, follow these steps: °¨ Before you urinate, fill the squirt bottle with warm water. Do not use hot water. °¨ After you urinate, while you are sitting on the toilet, use the squirt bottle to rinse the area around your urethra and vaginal opening. This rinses away any urine and blood. °¨ You may do this instead of wiping. As you start healing, you may use the squirt bottle before wiping yourself. Make sure to wipe gently. °¨ Fill the squirt bottle with clean water every time you use the bathroom. °· You will be given sanitary pads to wear. °How can I expect to feel? °· You may not feel the need to urinate for several hours after delivery. °· You will have some soreness and pain in your abdomen and vagina. °· If you are breastfeeding, you may have uterine contractions every time you breastfeed for up to several weeks postpartum. Uterine contractions help your uterus return to its normal size. °· It is normal to have vaginal bleeding (lochia) after delivery. The amount and appearance of lochia is often similar to a menstrual period in the first week after delivery. It will gradually decrease over the next few weeks to a   dry, yellow-brown discharge. For most women, lochia stops completely by 6-8 weeks after delivery. Vaginal bleeding can vary from woman to woman. °· Within the first few days after delivery, you may have breast engorgement. This is when your breasts feel heavy, full, and uncomfortable. Your breasts may also throb and feel hard, tightly stretched, warm, and tender. After this occurs, you may have milk leaking from your breasts. Your health care provider can help you relieve discomfort due to breast engorgement. Breast engorgement should go away within a few days. °· You may feel more sad or worried than normal due to  hormonal changes after delivery. These feelings should not last more than a few days. If these feelings do not go away after several days, speak with your health care provider. °How should I care for myself? °· Tell your health care provider if you have pain or discomfort. °· Drink enough water to keep your urine clear or pale yellow. °· Wash your hands thoroughly with soap and water for at least 20 seconds after changing your sanitary pads, after using the toilet, and before holding or feeding your baby. °· If you are not breastfeeding, avoid touching your breasts a lot. Doing this can make your breasts produce more milk. °· If you become weak or lightheaded, or you feel like you might faint, ask for help before: °¨ Getting out of bed. °¨ Showering. °· Change your sanitary pads frequently. Watch for any changes in your flow, such as a sudden increase in volume, a change in color, the passing of large blood clots. If you pass a blood clot from your vagina, save it to show to your health care provider. Do not flush blood clots down the toilet without having your health care provider look at them. °· Make sure that all your vaccinations are up to date. This can help protect you and your baby from getting certain diseases. You may need to have immunizations done before you leave the hospital. °· If desired, talk with your health care provider about methods of family planning or birth control (contraception). °How can I start bonding with my baby? °Spending as much time as possible with your baby is very important. During this time, you and your baby can get to know each other and develop a bond. Having your baby stay with you in your room (rooming in) can give you time to get to know your baby. Rooming in can also help you become comfortable caring for your baby. Breastfeeding can also help you bond with your baby. °How can I plan for returning home with my baby? °· Make sure that you have a car seat installed in your  vehicle. °¨ Your car seat should be checked by a certified car seat installer to make sure that it is installed safely. °¨ Make sure that your baby fits into the car seat safely. °· Ask your health care provider any questions you have about caring for yourself or your baby. Make sure that you are able to contact your health care provider with any questions after leaving the hospital. °This information is not intended to replace advice given to you by your health care provider. Make sure you discuss any questions you have with your health care provider. °Document Released: 11/24/2006 Document Revised: 07/02/2015 Document Reviewed: 01/01/2015 °[employer]vier Interactive Patient Education © 2017 [employer] °Breastfeeding and Mastitis °Mastitis is inflammation of the breast tissue. It can occur in women who are breastfeeding. This can make breastfeeding painful. Mastitis will   sometimes go away on its own. Your health care provider will help determine if treatment is needed. °CAUSES °Mastitis is often associated with a blocked milk (lactiferous) duct. This can happen when too much milk builds up in the breast. Causes of excess milk in the breast can include: °· Poor latch-on. If your baby is not latched onto the breast properly, she or he may not empty your breast completely while breastfeeding. °· Allowing too much time to pass between feedings. °· Wearing a bra or other clothing that is too tight. This puts extra pressure on the lactiferous ducts so milk does not flow through them as it should. °Mastitis can also be caused by a bacterial infection. Bacteria may enter the breast tissue through cuts or openings in the skin. In women who are breastfeeding, this may occur because of cracked or irritated skin. Cracks in the skin are often caused when your baby does not latch on properly to the breast. °SIGNS AND SYMPTOMS °· Swelling, redness, tenderness, and pain in an area of the breast. °· Swelling of the glands under the  arm on the same side. °· Fever may or may not accompany mastitis. °If an infection is allowed to progress, a collection of pus (abscess) may develop. °DIAGNOSIS  °Your health care provider can usually diagnose mastitis based on your symptoms and a physical exam. Tests may be done to help confirm the diagnosis. These may include: °· Removal of pus from the breast by applying pressure to the area. This pus can be examined in the lab to determine which bacteria are present. If an abscess has developed, the fluid in the abscess can be removed with a needle. This can also be used to confirm the diagnosis and determine the bacteria present. In most cases, pus will not be present. °· Blood tests to determine if your body is fighting a bacterial infection. °· Mammogram or ultrasound tests to rule out other problems or diseases. °TREATMENT  °Mastitis that occurs with breastfeeding will sometimes go away on its own. Your health care provider may choose to wait 24 hours after first seeing you to decide whether a prescription medicine is needed. If your symptoms are worse after 24 hours, your health care provider will likely prescribe an antibiotic medicine to treat the mastitis. He or she will determine which bacteria are most likely causing the infection and will then select an appropriate antibiotic medicine. This is sometimes changed based on the results of tests performed to identify the bacteria, or if there is no response to the antibiotic medicine selected. Antibiotic medicines are usually given by mouth. You may also be given medicine for pain. °HOME CARE INSTRUCTIONS °· Only take over-the-counter or prescription medicines for pain, fever, or discomfort as directed by your health care provider. °· If your health care provider prescribed an antibiotic medicine, take the medicine as directed. Make sure you finish it even if you start to feel better. °· Do not wear a tight or underwire bra. Wear a soft, supportive  bra. °· Increase your fluid intake, especially if you have a fever. °· Continue to empty the breast. Your health care provider can tell you whether this milk is safe for your infant or needs to be thrown out. You may be told to stop nursing until your health care provider thinks it is safe for your baby. Use a breast pump if you are advised to stop nursing. °· Keep your nipples clean and dry. °· Empty the first breast completely   before going to the other breast. If your baby is not emptying your breasts completely for some reason, use a breast pump to empty your breasts. °· If you go back to work, pump your breasts while at work to stay in time with your nursing schedule. °· Avoid allowing your breasts to become overly filled with milk (engorged). °SEEK MEDICAL CARE IF: °· You have pus-like discharge from the breast. °· Your symptoms do not improve with the treatment prescribed by your health care provider within 2 days. °SEEK IMMEDIATE MEDICAL CARE IF: °· Your pain and swelling are getting worse. °· You have pain that is not controlled with medicine. °· You have a red line extending from the breast toward your armpit. °· You have a fever or persistent symptoms for more than 2-3 days. °· You have a fever and your symptoms suddenly get worse. °MAKE SURE YOU:  °· Understand these instructions. °· Will watch your condition. °· Will get help right away if you are not doing well or get worse. °This information is not intended to replace advice given to you by your health care provider. Make sure you discuss any questions you have with your health care provider. °Document Released: 05/24/2004 Document Revised: 02/01/2013 Document Reviewed: 09/02/2012 °[employer]vier Interactive Patient Education © 2017 [employer] °Breastfeeding °Deciding to breastfeed is one of the best choices you can make for you and your baby. A change in hormones during pregnancy causes your breast tissue to grow and increases the number and size of your  milk ducts. These hormones also allow proteins, sugars, and fats from your blood supply to make breast milk in your milk-producing glands. Hormones prevent breast milk from being released before your baby is born as well as prompt milk flow after birth. Once breastfeeding has begun, thoughts of your baby, as well as his or her sucking or crying, can stimulate the release of milk from your milk-producing glands. °Benefits of breastfeeding °For Your Baby °· Your first milk (colostrum) helps your baby's digestive system function better. °· There are antibodies in your milk that help your baby fight off infections. °· Your baby has a lower incidence of asthma, allergies, and sudden infant death syndrome. °· The nutrients in breast milk are better for your baby than infant formulas and are designed uniquely for your baby’s needs. °· Breast milk improves your baby's brain development. °· Your baby is less likely to develop other conditions, such as childhood obesity, asthma, or type 2 diabetes mellitus. °For You °· Breastfeeding helps to create a very special bond between you and your baby. °· Breastfeeding is convenient. Breast milk is always available at the correct temperature and costs nothing. °· Breastfeeding helps to burn calories and helps you lose the weight gained during pregnancy. °· Breastfeeding makes your uterus contract to its prepregnancy size faster and slows bleeding (lochia) after you give birth. °· Breastfeeding helps to lower your risk of developing type 2 diabetes mellitus, osteoporosis, and breast or ovarian cancer later in life. °Signs that your baby is hungry °Early Signs of Hunger °· Increased alertness or activity. °· Stretching. °· Movement of the head from side to side. °· Movement of the head and opening of the mouth when the corner of the mouth or cheek is stroked (rooting). °· Increased sucking sounds, smacking lips, cooing, sighing, or squeaking. °· Hand-to-mouth movements. °· Increased  sucking of fingers or hands. °Late Signs of Hunger °· Fussing. °· Intermittent crying. °Extreme Signs of Hunger  °Signs of   extreme hunger will require calming and consoling before your baby will be able to breastfeed successfully. Do not wait for the following signs of extreme hunger to occur before you initiate breastfeeding: °· Restlessness. °· A loud, strong cry. °· Screaming. °Breastfeeding basics ° Breastfeeding Initiation °· Find a comfortable place to sit or lie down, with your neck and back well supported. °· Place a pillow or rolled up blanket under your baby to bring him or her to the level of your breast (if you are seated). Nursing pillows are specially designed to help support your arms and your baby while you breastfeed. °· Make sure that your baby's abdomen is facing your abdomen. °· Gently massage your breast. With your fingertips, massage from your chest wall toward your nipple in a circular motion. This encourages milk flow. You may need to continue this action during the feeding if your milk flows slowly. °· Support your breast with 4 fingers underneath and your thumb above your nipple. Make sure your fingers are well away from your nipple and your baby’s mouth. °· Stroke your baby's lips gently with your finger or nipple. °· When your baby's mouth is open wide enough, quickly bring your baby to your breast, placing your entire nipple and as much of the colored area around your nipple (areola) as possible into your baby's mouth. °¨ More areola should be visible above your baby's upper lip than below the lower lip. °¨ Your baby's tongue should be between his or her lower gum and your breast. °· Ensure that your baby's mouth is correctly positioned around your nipple (latched). Your baby's lips should create a seal on your breast and be turned out (everted). °· It is common for your baby to suck about 2-3 minutes in order to start the flow of breast milk. °Latching  °Teaching your baby how to latch  on to your breast properly is very important. An improper latch can cause nipple pain and decreased milk supply for you and poor weight gain in your baby. Also, if your baby is not latched onto your nipple properly, he or she may swallow some air during feeding. This can make your baby fussy. Burping your baby when you switch breasts during the feeding can help to get rid of the air. However, teaching your baby to latch on properly is still the best way to prevent fussiness from swallowing air while breastfeeding. °Signs that your baby has successfully latched on to your nipple: °· Silent tugging or silent sucking, without causing you pain. °· Swallowing heard between every 3-4 sucks. °· Muscle movement above and in front of his or her ears while sucking. °Signs that your baby has not successfully latched on to nipple: °· Sucking sounds or smacking sounds from your baby while breastfeeding. °· Nipple pain. °If you think your baby has not latched on correctly, slip your finger into the corner of your baby’s mouth to break the suction and place it between your baby's gums. Attempt breastfeeding initiation again. °Signs of Successful Breastfeeding  °Signs from your baby: °· A gradual decrease in the number of sucks or complete cessation of sucking. °· Falling asleep. °· Relaxation of his or her body. °· Retention of a small amount of milk in his or her mouth. °· Letting go of your breast by himself or herself. °Signs from you: °· Breasts that have increased in firmness, weight, and size 1-3 hours after feeding. °· Breasts that are softer immediately after breastfeeding. °· Increased milk volume,   as well as a change in milk consistency and color by the fifth day of breastfeeding. °· Nipples that are not sore, cracked, or bleeding. °Signs That Your Baby is Getting Enough Milk °· Wetting at least 1-2 diapers during the first 24 hours after birth. °· Wetting at least 5-6 diapers every 24 hours for the first week after  birth. The urine should be clear or pale yellow by 5 days after birth. °· Wetting 6-8 diapers every 24 hours as your baby continues to grow and develop. °· At least 3 stools in a 24-hour period by age 5 days. The stool should be soft and yellow. °· At least 3 stools in a 24-hour period by age 7 days. The stool should be seedy and yellow. °· No loss of weight greater than 10% of birth weight during the first 3 days of age. °· Average weight gain of 4-7 ounces (113-198 g) per week after age 4 days. °· Consistent daily weight gain by age 5 days, without weight loss after the age of 2 weeks. °After a feeding, your baby may spit up a small amount. This is common. °Breastfeeding frequency and duration °Frequent feeding will help you make more milk and can prevent sore nipples and breast engorgement. Breastfeed when you feel the need to reduce the fullness of your breasts or when your baby shows signs of hunger. This is called "breastfeeding on demand." Avoid introducing a pacifier to your baby while you are working to establish breastfeeding (the first 4-6 weeks after your baby is born). After this time you may choose to use a pacifier. Research has shown that pacifier use during the first year of a baby's life decreases the risk of sudden infant death syndrome (SIDS). °Allow your baby to feed on each breast as long as he or she wants. Breastfeed until your baby is finished feeding. When your baby unlatches or falls asleep while feeding from the first breast, offer the second breast. Because newborns are often sleepy in the first few weeks of life, you may need to awaken your baby to get him or her to feed. °Breastfeeding times will vary from baby to baby. However, the following rules can serve as a guide to help you ensure that your baby is properly fed: °· Newborns (babies 4 weeks of age or younger) may breastfeed every 1-3 hours. °· Newborns should not go longer than 3 hours during the day or 5 hours during the night  without breastfeeding. °· You should breastfeed your baby a minimum of 8 times in a 24-hour period until you begin to introduce solid foods to your baby at around 6 months of age. °Breast milk pumping °Pumping and storing breast milk allows you to ensure that your baby is exclusively fed your breast milk, even at times when you are unable to breastfeed. This is especially important if you are going back to work while you are still breastfeeding or when you are not able to be present during feedings. Your lactation consultant can give you guidelines on how long it is safe to store breast milk. °A breast pump is a machine that allows you to pump milk from your breast into a sterile bottle. The pumped breast milk can then be stored in a refrigerator or freezer. Some breast pumps are operated by hand, while others use electricity. Ask your lactation consultant which type will work best for you. Breast pumps can be purchased, but some hospitals and breastfeeding support groups lease breast pumps on   a monthly basis. A lactation consultant can teach you how to hand express breast milk, if you prefer not to use a pump. °Caring for your breasts while you breastfeed °Nipples can become dry, cracked, and sore while breastfeeding. The following recommendations can help keep your breasts moisturized and healthy: °· Avoid using soap on your nipples. °· Wear a supportive bra. Although not required, special nursing bras and tank tops are designed to allow access to your breasts for breastfeeding without taking off your entire bra or top. Avoid wearing underwire-style bras or extremely tight bras. °· Air dry your nipples for 3-4 minutes after each feeding. °· Use only cotton bra pads to absorb leaked breast milk. Leaking of breast milk between feedings is normal. °· Use lanolin on your nipples after breastfeeding. Lanolin helps to maintain your skin's normal moisture barrier. If you use pure lanolin, you do not need to wash it off  before feeding your baby again. Pure lanolin is not toxic to your baby. You may also hand express a few drops of breast milk and gently massage that milk into your nipples and allow the milk to air dry. °In the first few weeks after giving birth, some women experience extremely full breasts (engorgement). Engorgement can make your breasts feel heavy, warm, and tender to the touch. Engorgement peaks within 3-5 days after you give birth. The following recommendations can help ease engorgement: °· Completely empty your breasts while breastfeeding or pumping. You may want to start by applying warm, moist heat (in the shower or with warm water-soaked hand towels) just before feeding or pumping. This increases circulation and helps the milk flow. If your baby does not completely empty your breasts while breastfeeding, pump any extra milk after he or she is finished. °· Wear a snug bra (nursing or regular) or tank top for 1-2 days to signal your body to slightly decrease milk production. °· Apply ice packs to your breasts, unless this is too uncomfortable for you. °· Make sure that your baby is latched on and positioned properly while breastfeeding. °If engorgement persists after 48 hours of following these recommendations, contact your health care provider or a lactation consultant. °Overall health care recommendations while breastfeeding °· Eat healthy foods. Alternate between meals and snacks, eating 3 of each per day. Because what you eat affects your breast milk, some of the foods may make your baby more irritable than usual. Avoid eating these foods if you are sure that they are negatively affecting your baby. °· Drink milk, fruit juice, and water to satisfy your thirst (about 10 glasses a day). °· Rest often, relax, and continue to take your prenatal vitamins to prevent fatigue, stress, and anemia. °· Continue breast self-awareness checks. °· Avoid chewing and smoking tobacco. Chemicals from cigarettes that pass  into breast milk and exposure to secondhand smoke may harm your baby. °· Avoid alcohol and drug use, including marijuana. °Some medicines that may be harmful to your baby can pass through breast milk. It is important to ask your health care provider before taking any medicine, including all over-the-counter and prescription medicine as well as vitamin and herbal supplements. °It is possible to become pregnant while breastfeeding. If birth control is desired, ask your health care provider about options that will be safe for your baby. °Contact a health care provider if: °· You feel like you want to stop breastfeeding or have become frustrated with breastfeeding. °· You have painful breasts or nipples. °· Your nipples are cracked or bleeding. °·   Your breasts are red, tender, or warm. °· You have a swollen area on either breast. °· You have a fever or chills. °· You have nausea or vomiting. °· You have drainage other than breast milk from your nipples. °· Your breasts do not become full before feedings by the fifth day after you give birth. °· You feel sad and depressed. °· Your baby is too sleepy to eat well. °· Your baby is having trouble sleeping. °· Your baby is wetting less than 3 diapers in a 24-hour period. °· Your baby has less than 3 stools in a 24-hour period. °· Your baby's skin or the white part of his or her eyes becomes yellow. °· Your baby is not gaining weight by 5 days of age. °Get help right away if: °· Your baby is overly tired (lethargic) and does not want to wake up and feed. °· Your baby develops an unexplained fever. °This information is not intended to replace advice given to you by your health care provider. Make sure you discuss any questions you have with your health care provider. °Document Released: 01/27/2005 Document Revised: 07/11/2015 Document Reviewed: 07/21/2012 °Elsevier Interactive Patient Education © 2017 [employer] °Breast Pumping Tips °If you are breastfeeding, there may be  times when you cannot feed your baby directly. Returning to work or going on a trip are common examples. Pumping allows you to store breast milk and feed it to your baby later. °You may not get much milk when you first start to pump. Your breasts should start to make more after a few days. If you pump at the times you usually feed your baby, you may be able to keep making enough milk to feed your baby without also using formula. The more often you pump, the more milk you will produce. °When should I pump? °· You can begin to pump soon after delivery. However, some experts recommend waiting about 4 weeks before giving your infant a bottle to make sure breastfeeding is going well. °· If you plan to return to work, begin pumping a few weeks before. This will help you develop techniques that work best for you. It also lets you build up a supply of breast milk. °· When you are with your infant, feed on demand and pump after each feeding. °· When you are away from your infant for several hours, pump for about 15 minutes every 2-3 hours. Pump both breasts at the same time if you can. °· If your infant has a formula feeding, make sure to pump around the same time. °· If you drink any alcohol, wait 2 hours before pumping. °How do I prepare to pump? °Your let-down reflex is the natural reaction to stimulation that makes your breast milk flow. It is easier to stimulate this reflex when you are relaxed. Find relaxation techniques that work for you. If you have difficulty with your let-down reflex, try these methods: °· Smell one of your infant's blankets or an item of clothing. °· Look at a picture or video of your infant. °· Sit in a quiet, private space. °· Massage the breast you plan to pump. °· Place soothing warmth on the breast. °· Play relaxing music. °What are some general breast pumping tips? °· Wash your hands before you pump. You do not need to wash your nipples or breasts. °· There are three ways to pump. °¨ You can  use your hand to massage and compress your breast. °¨ You can use a handheld manual   pump. °¨ You can use an electric pump. °· Make sure the suction cup (flange) on the breast pump is the right size. Place the flange directly over the nipple. If it is the wrong size or placed the wrong way, it may be painful and cause nipple damage. °· If pumping is uncomfortable, apply a small amount of purified or modified lanolin to your nipple and areola. °· If you are using an electric pump, adjust the speed and suction power to be more comfortable. °· If pumping is painful or if you are not getting very much milk, you may need a different type of pump. A lactation consultant can help you determine what type of pump to use. °· Keep a full water bottle near you at all times. Drinking lots of fluid helps you make more milk. °· You can store your milk to use later. Pumped breast milk can be stored in a sealable, sterile container or plastic bag. Label all stored breast milk with the date you pumped it. °¨ Milk can stay out at room temperature for up to 8 hours. °¨ You can store your milk in the refrigerator for up to 8 days. °¨ You can store your milk in the freezer for 3 months. Thaw frozen milk using warm water. Do not put it in the microwave. °· Do not smoke. Smoking can lower your milk supply and harm your infant. If you need help quitting, ask your health care provider to recommend a program. °When should I call my health care provider or a lactation consultant? °· You are having trouble pumping. °· You are concerned that you are not making enough milk. °· You have nipple pain, soreness, or redness. °· You want to use birth control. Birth control pills may lower your milk supply. Talk to your health care provider about your options. °This information is not intended to replace advice given to you by your health care provider. Make sure you discuss any questions you have with your health care provider. °Document Released:  07/17/2009 Document Revised: 07/11/2015 Document Reviewed: 11/19/2012 °Elsevier Interactive Patient Education © 2017 Elsevier Inc. ° °

## 2016-03-16 NOTE — Discharge Summary (Signed)
OB Discharge Summary     Patient Name: Sharon [name] DOB: 1980-10-19 MRN: [id number]  Date of admission: 03/15/2016 Delivering MD: Olivia Mackie   Date of discharge: 03/16/2016  Admitting diagnosis: 39wks contractions 2 mins and strong Intrauterine pregnancy: [redacted]w[redacted]d     Secondary diagnosis:  Principal Problem:   Postpartum care following vaginal delivery (2/3) Active Problems:   Normal labor   Spontaneous vaginal delivery   Second-degree perineal laceration, with delivery  Additional problems: none     Discharge diagnosis: Term Pregnancy Delivered                                                                                                Post partum procedures:none  Augmentation: n/a  Complications: None  Hospital course:  Onset of Labor With Vaginal Delivery     36 y.o. yo G2P2002 at [redacted]w[redacted]d was admitted in Active Labor on 03/15/2016. Patient had an uncomplicated labor course as follows:  Membrane Rupture Time/Date: 9:43 AM ,03/15/2016   Intrapartum Procedures: Episiotomy: None [1]                                         Lacerations:  2nd degree [3];Perineal [11]  Patient had a delivery of a Viable infant. 03/15/2016  Information for the patient's newborn:  Elnor, Renovato [[id number]]  Delivery Method: Vaginal, Spontaneous Delivery (Filed from Delivery Summary)    Pateint had an uncomplicated postpartum course.  She is ambulating, tolerating a regular diet, passing flatus, and urinating well. Patient is discharged home in stable condition on 03/16/16.   Physical exam  Vitals:   03/15/16 1145 03/15/16 1245 03/15/16 1645 03/16/16 0616  BP: 128/70 117/62 130/68 123/70  Pulse: (!) 59 70 78 67  Resp: 18 18 19 16   Temp: 98 F (36.7 C) 97.8 F (36.6 C) 98.6 F (37 C) 98.5 F (36.9 C)  TempSrc: Oral Oral Oral Oral  SpO2:   97%   Weight:      Height:       General: alert, cooperative and no distress Lochia: appropriate Uterine Fundus: firm, midline, U-1 DVT  Evaluation: No evidence of DVT seen on physical exam. Negative Homan's sign. No cords or calf tenderness. No significant calf/ankle edema. Labs: Lab Results  Component Value Date   WBC 11.3 (H) 03/16/2016   HGB 11.7 (L) 03/16/2016   HCT 34.1 (L) 03/16/2016   MCV 89.3 03/16/2016   PLT 221 03/16/2016   No flowsheet data found.  Discharge instruction: per After Visit Summary and "Baby and Me Booklet".  After visit meds:  Allergies as of 03/16/2016   No Known Allergies     Medication List    TAKE these medications   prenatal multivitamin Tabs tablet Take 1 tablet by mouth daily at 12 noon.   vitamin C 100 MG tablet Take 100 mg by mouth daily.       Diet: routine diet  Activity: Advance as tolerated. Pelvic rest for 6 weeks.   Outpatient follow up:6  weeks Follow up Appt:No future appointments. Follow up Visit:No Follow-up on file.  Postpartum contraception: Undecided  Newborn Data: Live born female "Malena PeerClive" on 03/15/2016 Birth Weight: 7 lb 10.9 oz (3485 g) APGAR: 8, 9  Baby Feeding: Breast Disposition:home with mother   03/16/2016 Raelyn MoraAWSON, Adilen Pavelko, Judie PetitM, CNM

## 2016-03-16 NOTE — Progress Notes (Signed)
Patient ID: Nichola SizerMary L Vukelich, female   DOB: 10/25/1980, 36 y.o.   MRN: [id number] PPD # 1 SVD 2nd Degree Perineal Laceration  S:  Reports feeling well. Desires an early d/c home today.             Tolerating po/ No nausea or vomiting             Bleeding is light             Pain controlled with ibuprofen (OTC)             Up ad lib / ambulatory / voiding without difficulties     Information for the patient's newborn:  Nicoletta BaRoberts, Boy Alithea [[id number]][[id number]]  female "Malena PeerClive"   breast feeding  / Circumcision planning   O:  A & O x 3, in no apparent distress              VS:  Vitals:   03/15/16 1145 03/15/16 1245 03/15/16 1645 03/16/16 0616  BP: 128/70 117/62 130/68 123/70  Pulse: (!) 59 70 78 67  Resp: 18 18 19 16   Temp: 98 F (36.7 C) 97.8 F (36.6 C) 98.6 F (37 C) 98.5 F (36.9 C)  TempSrc: Oral Oral Oral Oral  SpO2:   97%   Weight:      Height:        LABS:  Recent Labs  03/15/16 0903 03/16/16 0550  WBC 13.3* 11.3*  HGB 14.0 11.7*  HCT 40.1 34.1*  PLT 234 221    Blood type: --/--/A POS, A POS (02/03 0903)  Rubella:   Immune     Abdomen: soft, non-tender, non-distended             Fundus: firm, non-tender, U-1  Perineum: 2nd degree repair healing well, no edema  Lochia: minimal  Extremities: No edema, no calf pain or tenderness    A/P: PPD # 1 35 y.o., [id number]   Principal Problem:   Postpartum care following vaginal delivery (2/3) Active Problems:   Normal labor   Spontaneous vaginal delivery   Second-degree perineal laceration, with delivery    Doing well - stable status  Routine post partum orders  D/C home today  F/U with Dr. Billy Coastaavon in 6 wks  WOB PP booklet given    Raelyn MoraAWSON, Jacee Enerson, M, MSN, CNM 03/16/2016, 8:22 AM

## 2016-03-25 ENCOUNTER — Encounter (HOSPITAL_COMMUNITY): Payer: Self-pay | Admitting: Emergency Medicine

## 2016-03-25 ENCOUNTER — Ambulatory Visit (HOSPITAL_COMMUNITY)
Admission: EM | Admit: 2016-03-25 | Discharge: 2016-03-25 | Disposition: A | Discharge status: Home / Self Care | Payer: BLUE CROSS/BLUE SHIELD | Attending: Family Medicine | Admitting: Family Medicine

## 2016-03-25 DIAGNOSIS — N61 Mastitis without abscess: Secondary | ICD-10-CM

## 2016-03-25 NOTE — Discharge Instructions (Signed)
I have [facility] with the [facility] call [facility] provider [facility]. [facility] from [facility] I advise apply warm compresses tonight, express and pump often, and follow up with your OB, Dr. Billy Coastaavon in the morning.

## 2016-03-25 NOTE — ED Notes (Signed)
Chaperoned Sharon BodoLawrence Kennard, NP for a right breast exam on the pt.  A culture was obtained and pt tolerated well.
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
## 2016-04-16 DIAGNOSIS — H16223 Keratoconjunctivitis sicca, not specified as Sjogren's, bilateral: Secondary | ICD-10-CM | POA: Diagnosis not present

## 2016-05-09 LAB — HM PAP SMEAR

## 2016-06-04 DIAGNOSIS — F341 Dysthymic disorder: Secondary | ICD-10-CM | POA: Diagnosis not present

## 2016-06-18 DIAGNOSIS — F341 Dysthymic disorder: Secondary | ICD-10-CM | POA: Diagnosis not present

## 2016-07-09 DIAGNOSIS — F341 Dysthymic disorder: Secondary | ICD-10-CM | POA: Diagnosis not present

## 2016-07-23 DIAGNOSIS — Z8632 Personal history of gestational diabetes: Secondary | ICD-10-CM | POA: Diagnosis not present

## 2016-07-30 DIAGNOSIS — F341 Dysthymic disorder: Secondary | ICD-10-CM | POA: Diagnosis not present

## 2016-08-06 DIAGNOSIS — F341 Dysthymic disorder: Secondary | ICD-10-CM | POA: Diagnosis not present

## 2016-08-11 DIAGNOSIS — F341 Dysthymic disorder: Secondary | ICD-10-CM | POA: Diagnosis not present

## 2016-08-19 DIAGNOSIS — F341 Dysthymic disorder: Secondary | ICD-10-CM | POA: Diagnosis not present

## 2016-09-09 IMAGING — US US BREAST LTD UNI LEFT INC AXILLA
1 series · 4 of 4 positions shown · non-contrast
Comparison: Bilateral mammogram 08/05/2011 and bilateral breast
ultrasound on 12/15/2012.

CLINICAL DATA: 34-year-old patient presents for followup of
bilateral probable fibroadenomas. She also has bilateral delayed
milky discharge when expressed. She has not time blur. The patient
has had excisional biopsies before for fibroadenomas. Additionally,
she underwent ultrasound-guided core biopsy of a benign fibroadenoma
in the right breast in August 2011. The patient's sister was diagnosed
with breast cancer at age 21. The patient's mother also has a
history of breast cancer, diagnosed age 62. Previously, the patient
was evaluated once by the genetic counselor at [HOSPITAL].
The patient states that she was asked to return after her sisters
genetic testing results were available. Patient states that her
sister did test positive for 1 of the genetic mutations associated
with breast cancer. The patient is interested in being referred to
the genetic counselor at [REDACTED] for
follow-up. She has had 1 screening breast MRI in July 2011.

The patient states that she has not noticed any new or enlarging
masses, but she has not specifically checked.
EXAM:
DIGITAL DIAGNOSTIC BILATERAL MAMMOGRAM WITH 3D TOMOSYNTHESIS WITH
CAD
ULTRASOUND BILATERAL BREAST

[Series 1: us breast ltd uni left inc axilla · 0.06mm/px · 4 of 4 slices shown]
[im 1/4]
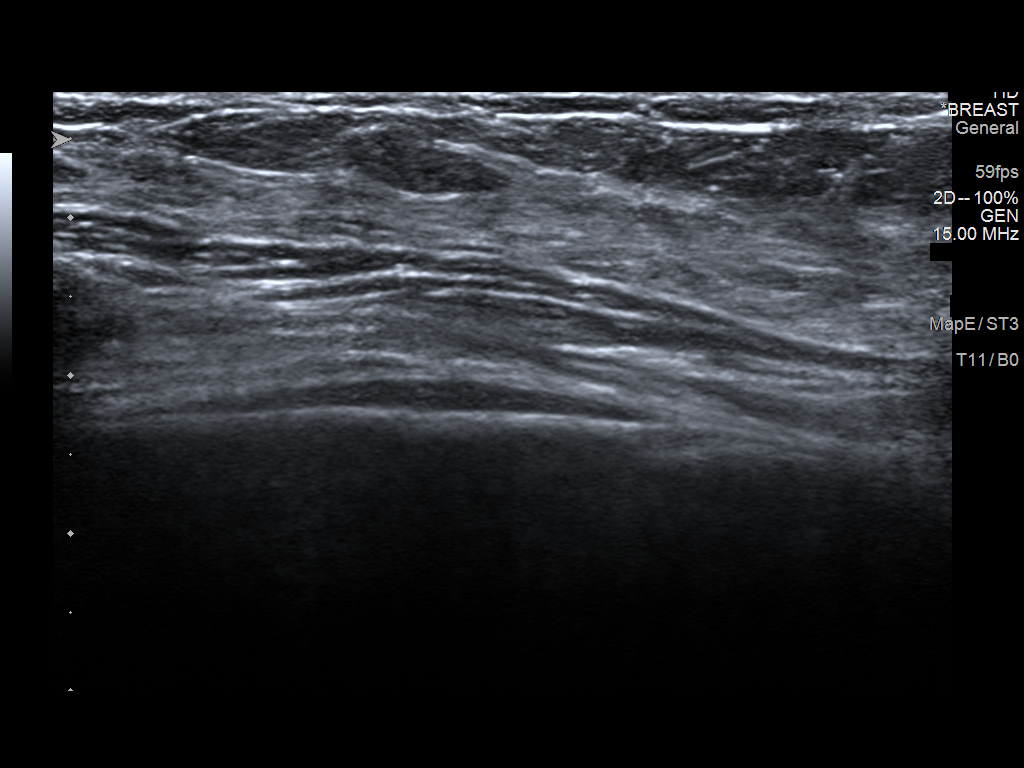
[im 2/4]
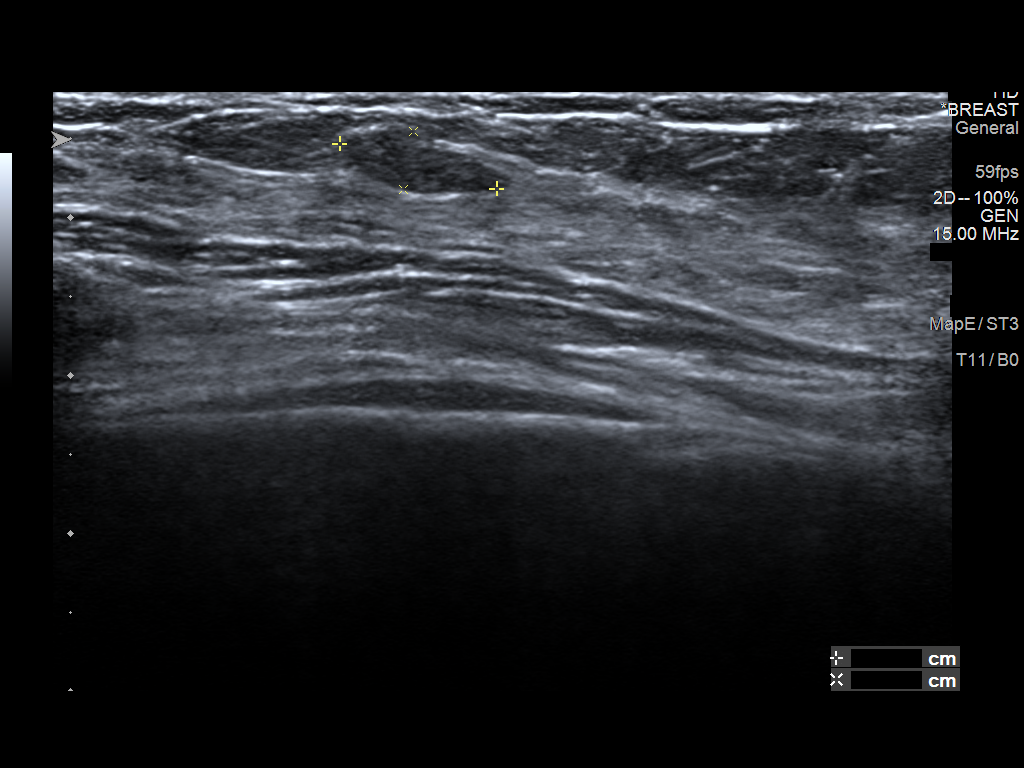
[im 3/4]
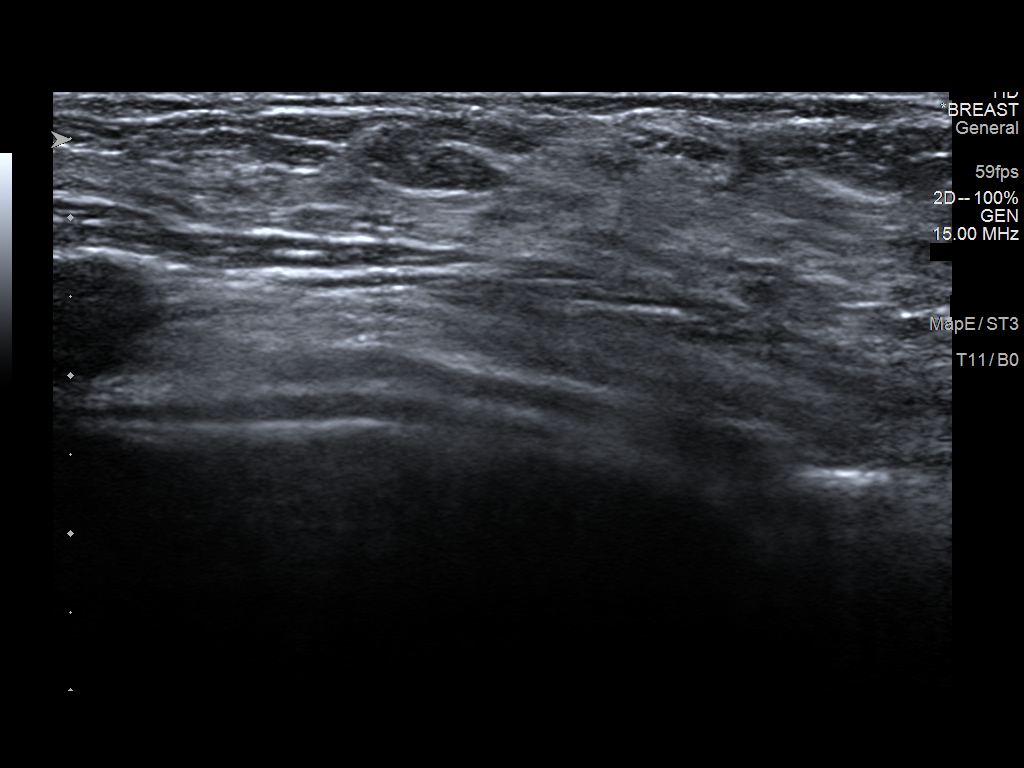
[im 4/4]
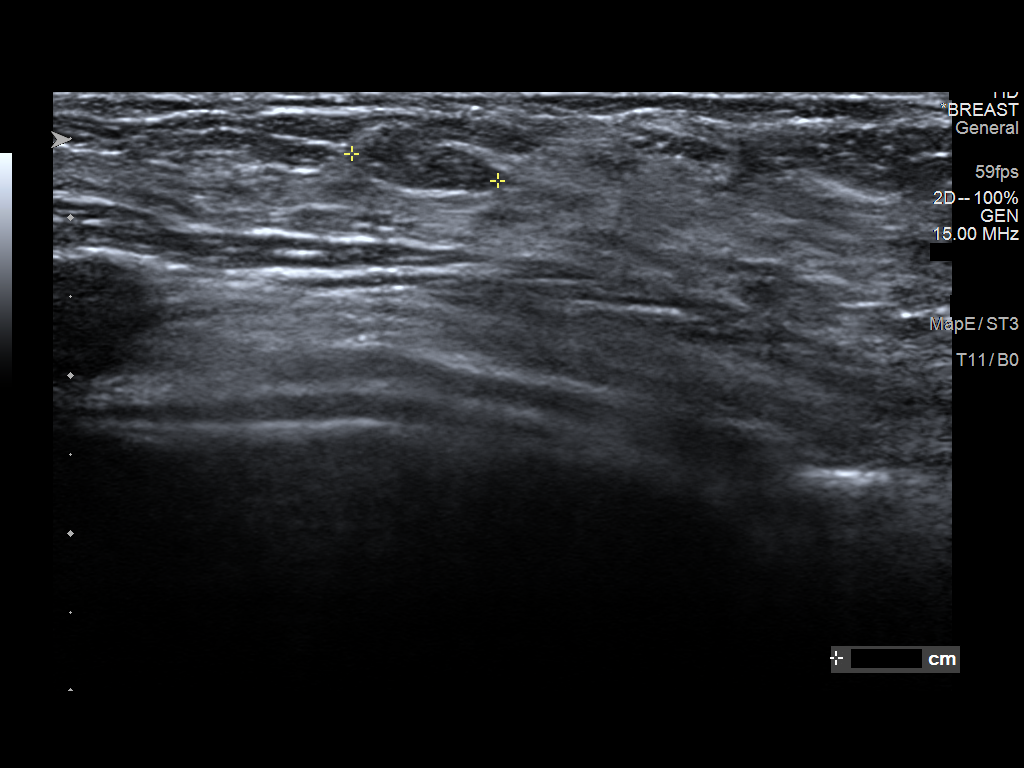

[4 of 4 positions shown; findings below may reference images not displayed]

ACR Breast Density Category d: The breast tissue is extremely dense,
which lowers the sensitivity of mammography.
FINDINGS: A biopsy clip is seen in the 9 o'clock region of the right breast.
This corresponds to previously biopsied benign fibroadenoma. No
suspicious mass is seen in either breast on mammography. No evidence
of distortion or suspicious microcalcification in either breast.

Mammographic images were processed with CAD.

On physical exam, I palpate a smooth mobile mass at 9 o'clock
position right breast approximately 6 cm from the nipple. No mass is
palpated in the 10 o'clock region of the right breast for the 8
o'clock region of the left breast.

Targeted ultrasound is performed, showing a macrolobulated oval
hypoechoic mass with an internal echogenic structure consistent with
the biopsy clip at 9 o'clock position 6 cm from the nipple. This
biopsy proven fibroadenoma measures approximately 1.8 x 1.3 x
cm. Given differences in measurement technique, this is without
change compared to the ultrasound of 12/15/2012. I do not see a
separate mass at 9 o'clock position 5 cm from the nipple has
previously described.

The at 10 o'clock position 6 cm from the nipple is an oval
macrolobulated circumscribed mass measuring 1.7 x 1.2 x 0.7 cm. This
has imaging features compatible with a fibroadenoma and shows no
significant change since the prior ultrasound.

At 10 o'clock position 3 cm from the nipple is a similar-appearing
circumscribed oval hypoechoic mass measuring approximately 1.2 x
x 1.2 cm. This is without significant change since the prior
ultrasound.

The ultrasound of 12/15/2012 describes 2 adjacent masses at 10
o'clock position 3 cm from the nipple. I only detect 1 mass in this
region today, despite multiple attempts to identify second mass in
this region.

Ultrasound of the left breast demonstrates a nearly isoechoic oval
circumscribed mass 8 o'clock position 7 cm from the nipple that
measures 1.0 x 0.4 x 0.9 cm. This likely reflects a benign
fibroadenoma and shows no interval change. Alternatively, this could
be a benign fat lobule.
IMPRESSION: Bilateral fibroadenomas remain stable dating back to ultrasounds of
4863 and 6493 and can be considered benign.

RECOMMENDATION:
Screening mammogram in one year.(Code:PC-6-R54)

Given the patient's family history of breast cancer, I recommend a
referral to the [REDACTED] for follow-up
genetic counseling. The patient was seen there once previously; now,
her sister's genetic testing results are available for the patient
to discuss with genetics counseling.

Bilateral screening breast MRI should be considered, based on
calculated risk results after consultation with the genetic
counselor. If a lifetime risk of 20% or greater is calculated for
this patient, and/or if the patient has genetic testing performed
that indicates a genetic predisposition to breast cancer, then
bilateral screening breast MRI would be recommended annually.

I have discussed the findings and recommendations with the patient.
Results were also provided in writing at the conclusion of the
visit. If applicable, a reminder letter will be sent to the patient
regarding the next appointment.

BI-RADS CATEGORY  2: Benign.

## 2016-09-17 DIAGNOSIS — F341 Dysthymic disorder: Secondary | ICD-10-CM | POA: Diagnosis not present

## 2016-10-01 DIAGNOSIS — F341 Dysthymic disorder: Secondary | ICD-10-CM | POA: Diagnosis not present

## 2016-10-05 DIAGNOSIS — F341 Dysthymic disorder: Secondary | ICD-10-CM | POA: Diagnosis not present

## 2016-10-29 DIAGNOSIS — F341 Dysthymic disorder: Secondary | ICD-10-CM | POA: Diagnosis not present

## 2016-11-05 DIAGNOSIS — F341 Dysthymic disorder: Secondary | ICD-10-CM | POA: Diagnosis not present

## 2016-11-09 ENCOUNTER — Encounter (HOSPITAL_COMMUNITY): Payer: Self-pay | Admitting: Emergency Medicine

## 2016-11-09 ENCOUNTER — Ambulatory Visit (HOSPITAL_COMMUNITY)
Admission: EM | Admit: 2016-11-09 | Discharge: 2016-11-09 | Disposition: A | Discharge status: Home / Self Care | Payer: BLUE CROSS/BLUE SHIELD | Attending: Urgent Care | Admitting: Urgent Care

## 2016-11-09 DIAGNOSIS — J029 Acute pharyngitis, unspecified: Secondary | ICD-10-CM

## 2016-11-09 DIAGNOSIS — H9202 Otalgia, left ear: Secondary | ICD-10-CM

## 2016-11-09 DIAGNOSIS — R011 Cardiac murmur, unspecified: Secondary | ICD-10-CM

## 2016-11-09 MED ORDER — BENZONATATE 100 MG PO CAPS: 100.0000 mg | ORAL_CAPSULE | Freq: Three times a day (TID) | ORAL | 0 refills | Status: DC | PRN

## 2016-11-09 MED ORDER — AMOXICILLIN 875 MG PO TABS: 875.0000 mg | ORAL_TABLET | Freq: Two times a day (BID) | ORAL | 0 refills | Status: DC

## 2016-11-09 NOTE — ED Notes (Signed)
Patient discharged by provider.

## 2016-11-09 NOTE — ED Triage Notes (Signed)
Complains of body aches, chills, fever, sore throat on left side of throat and left ear pain.  symptoms started last saturday
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
## 2016-11-09 NOTE — Discharge Instructions (Signed)
For sore throat try using a honey-based tea. Use 3 teaspoons of honey with juice squeezed from half lemon. Place shaved pieces of ginger into 1/2-1 cup of water and warm over stove top. Then mix the ingredients and repeat every 4 hours as needed. 

## 2016-11-13 DIAGNOSIS — F341 Dysthymic disorder: Secondary | ICD-10-CM | POA: Diagnosis not present

## 2016-11-26 DIAGNOSIS — F341 Dysthymic disorder: Secondary | ICD-10-CM | POA: Diagnosis not present

## 2016-12-18 DIAGNOSIS — F341 Dysthymic disorder: Secondary | ICD-10-CM | POA: Diagnosis not present

## 2017-01-07 DIAGNOSIS — F341 Dysthymic disorder: Secondary | ICD-10-CM | POA: Diagnosis not present

## 2017-01-21 DIAGNOSIS — F341 Dysthymic disorder: Secondary | ICD-10-CM | POA: Diagnosis not present

## 2017-01-28 DIAGNOSIS — F341 Dysthymic disorder: Secondary | ICD-10-CM | POA: Diagnosis not present

## 2017-02-18 DIAGNOSIS — F341 Dysthymic disorder: Secondary | ICD-10-CM | POA: Diagnosis not present

## 2017-03-04 DIAGNOSIS — F341 Dysthymic disorder: Secondary | ICD-10-CM | POA: Diagnosis not present

## 2017-03-11 ENCOUNTER — Encounter: Payer: Self-pay | Admitting: Family Medicine

## 2017-03-11 ENCOUNTER — Ambulatory Visit: Payer: BLUE CROSS/BLUE SHIELD | Admitting: Family Medicine

## 2017-03-11 ENCOUNTER — Other Ambulatory Visit: Payer: Self-pay

## 2017-03-11 VITALS — BP 100/64 | HR 74 | Temp 98.5°F | Resp 16 | Ht 66.0 in | Wt 159.1 lb

## 2017-03-11 DIAGNOSIS — Z8632 Personal history of gestational diabetes: Secondary | ICD-10-CM

## 2017-03-11 DIAGNOSIS — E663 Overweight: Secondary | ICD-10-CM | POA: Diagnosis not present

## 2017-03-11 DIAGNOSIS — G479 Sleep disorder, unspecified: Secondary | ICD-10-CM

## 2017-03-11 LAB — LIPID PANEL
Cholesterol: 118 mg/dL (ref 0–200)
HDL: 40.2 mg/dL (ref >39.00)
LDL Cholesterol: 69 mg/dL (ref 0–99)
NonHDL: 78.05
Total CHOL/HDL Ratio: 3
Triglycerides: 43 mg/dL (ref 0.0–149.0)
VLDL: 8.6 mg/dL (ref 0.0–40.0)

## 2017-03-11 LAB — CBC WITH DIFFERENTIAL/PLATELET
Basophils Absolute: 0 10*3/uL (ref 0.0–0.1)
Basophils Relative: 0.8 % (ref 0.0–3.0)
Eosinophils Absolute: 0.1 10*3/uL (ref 0.0–0.7)
Eosinophils Relative: 2.8 % (ref 0.0–5.0)
HCT: 39.2 % (ref 36.0–46.0)
Hemoglobin: 13.3 g/dL (ref 12.0–15.0)
Lymphocytes Relative: 27.5 % (ref 12.0–46.0)
Lymphs Abs: 1.4 10*3/uL (ref 0.7–4.0)
MCHC: 34 g/dL (ref 30.0–36.0)
MCV: 90.3 fl (ref 78.0–100.0)
Monocytes Absolute: 0.4 10*3/uL (ref 0.1–1.0)
Monocytes Relative: 8.6 % (ref 3.0–12.0)
Neutro Abs: 3 10*3/uL (ref 1.4–7.7)
Neutrophils Relative %: 60.3 % (ref 43.0–77.0)
Platelets: 400 10*3/uL (ref 150.0–400.0)
RBC: 4.34 Mil/uL (ref 3.87–5.11)
RDW: 13.2 % (ref 11.5–15.5)
WBC: 5 10*3/uL (ref 4.0–10.5)

## 2017-03-11 LAB — BASIC METABOLIC PANEL
BUN: 12 mg/dL (ref 6–23)
CO2: 29 mEq/L (ref 19–32)
Calcium: 8.8 mg/dL (ref 8.4–10.5)
Chloride: 105 mEq/L (ref 96–112)
Creatinine, Ser: 0.71 mg/dL (ref 0.40–1.20)
GFR: 99.94 mL/min (ref >60.00)
Glucose, Bld: 96 mg/dL (ref 70–99)
Potassium: 4.7 mEq/L (ref 3.5–5.1)
Sodium: 139 mEq/L (ref 135–145)

## 2017-03-11 LAB — HEPATIC FUNCTION PANEL
ALT: 15 U/L (ref 0–35)
AST: 14 U/L (ref 0–37)
Albumin: 4.1 g/dL (ref 3.5–5.2)
Alkaline Phosphatase: 33 U/L — ABNORMAL LOW (ref 39–117)
Bilirubin, Direct: 0.1 mg/dL (ref 0.0–0.3)
Total Bilirubin: 0.3 mg/dL (ref 0.2–1.2)
Total Protein: 6.6 g/dL (ref 6.0–8.3)

## 2017-03-11 LAB — TSH: TSH: 1.5 u[IU]/mL (ref 0.35–4.50)

## 2017-03-11 MED ORDER — SUVOREXANT 10 MG PO TABS: 1.0000 | ORAL_TABLET | Freq: Every day | ORAL | 3 refills | Status: DC

## 2017-03-11 NOTE — Assessment & Plan Note (Signed)
New to provider.  This is concerning for pt as she wants to lose weight.  Discussed need for healthy diet and regular exercise.  Check labs to risk stratify.  Will follow.

## 2017-03-11 NOTE — Patient Instructions (Signed)
Follow up in 1 month to recheck sleep We'll notify you of your lab results and make any changes if needed Start the Belsomra nightly to improve sleep Continue to work on healthy diet and regular exercise- you're doing great!! Call with any questions or concerns Welcome!  We're glad to have you!!!

## 2017-03-11 NOTE — Progress Notes (Signed)
   Subjective:    Patient ID: Sharon [name], female    DOB: 1980/06/20, 37 y.o.   MRN: [id number]  HPI New to establish.  Previous MD- Tysinger  GYN- Taavon  ? Heart murmur- pt was told this fall that she had a murmur when she was seen for a sick visit.  Denies CP, SOB, edema.  Overweight- pt's BMI is 25.68.  Exercising regularly.  Hx of gestational diabetes.  Insomnia- pt reports that since age 37 she is waking up 10-12x/night.  No snoring, no reported breathing pauses.  Pt is taking OTC Sleep Complex nightly.  Occasionally adding Unisom.  On 2 sleep meds, will wake up 2-3x/night.  Has never been on prescription meds.  Able to fall back asleep w/o difficulty.   Review of Systems For ROS see HPI     Objective:   Physical Exam  Constitutional: She is oriented to person, place, and time. She appears well-developed and well-nourished. No distress.  HENT:  Head: Normocephalic and atraumatic.  Eyes: Conjunctivae and EOM are normal. Pupils are equal, round, and reactive to light.  Neck: Normal range of motion. Neck supple. No thyromegaly present.  Cardiovascular: Normal rate, regular rhythm, normal heart sounds and intact distal pulses.  No murmur heard. Pulmonary/Chest: Effort normal and breath sounds normal. No respiratory distress.  Abdominal: Soft. She exhibits no distension. There is no tenderness.  Musculoskeletal: She exhibits no edema.  Lymphadenopathy:    She has no cervical adenopathy.  Neurological: She is alert and oriented to person, place, and time.  Skin: Skin is warm and dry.  Psychiatric: She has a normal mood and affect. Her behavior is normal.  Vitals reviewed.         Assessment & Plan:

## 2017-03-11 NOTE — Progress Notes (Signed)
Please remove me as PCP so I do not get copied on her visits since she is not coming here

## 2017-03-11 NOTE — Assessment & Plan Note (Signed)
Pt has had sleep issues x15 yrs.  She wakes frequently throughout the night.  She is able to fall asleep w/o difficulty and she is able to fall back asleep.  Pt is not interested in a medication that is going to put her out since she has 2 young children.  After reviewing our options, will start Belsomra.  If no improvement, will need sleep study.  Pt expressed understanding and is in agreement w/ plan.

## 2017-03-11 NOTE — Assessment & Plan Note (Signed)
Pt's sugars resolved after delivery.  She is not diabetic at this time but she is concerned that this will progress in the future.  Will follow.

## 2017-03-12 ENCOUNTER — Encounter: Payer: Self-pay | Admitting: General Practice

## 2017-03-13 ENCOUNTER — Telehealth: Payer: Self-pay | Admitting: Family Medicine

## 2017-03-13 NOTE — Telephone Encounter (Signed)
Patient  Called  Requesting a  Different medication  --   She  Was  RX  Suvorexant    On office  Visit    03-11-17     She  States  It is  Not  Covered by  Her  Insurance.  [facility]   [address]  [place]

## 2017-03-13 NOTE — Telephone Encounter (Signed)
Copied from CRM [phone]. Topic: Quick Communication - See Telephone Encounter >> Mar 13, 2017  2:38 PM Cipriano BunkerLambe, Annette S wrote: CRM for notification. See Telephone encounter for:   Pt. Says pharmacy  told her Suvorexant (BELSOMRA) 10 MG TABS prescription was not approved from ins. Co.     Asking if can call in something else   Please let patient know.  [id number] [place] [place], [place] - [address] [place] [address] Phone: [phone] Fax: [phone]   03/13/17.

## 2017-03-16 MED ORDER — TRAZODONE HCL 50 MG PO TABS: 25.0000 mg | ORAL_TABLET | Freq: Every evening | ORAL | 3 refills | Status: DC | PRN

## 2017-03-16 NOTE — Telephone Encounter (Signed)
Called and left a detailed message to advise pt that Rx was sent to local pharmacy

## 2017-03-16 NOTE — Telephone Encounter (Signed)
Ok for Trazodone 50mg , 1/2-1 tab nightly, #30, 3 refills

## 2017-03-18 DIAGNOSIS — F341 Dysthymic disorder: Secondary | ICD-10-CM | POA: Diagnosis not present

## 2017-04-01 DIAGNOSIS — F341 Dysthymic disorder: Secondary | ICD-10-CM | POA: Diagnosis not present

## 2017-04-09 ENCOUNTER — Ambulatory Visit (INDEPENDENT_AMBULATORY_CARE_PROVIDER_SITE_OTHER): Payer: BLUE CROSS/BLUE SHIELD | Admitting: Family Medicine

## 2017-04-09 ENCOUNTER — Other Ambulatory Visit: Payer: Self-pay

## 2017-04-09 ENCOUNTER — Encounter: Payer: Self-pay | Admitting: Family Medicine

## 2017-04-09 VITALS — BP 104/70 | HR 64 | Temp 98.1°F | Resp 17 | Ht 66.0 in | Wt 155.2 lb

## 2017-04-09 DIAGNOSIS — G479 Sleep disorder, unspecified: Secondary | ICD-10-CM | POA: Diagnosis not present

## 2017-04-09 DIAGNOSIS — J301 Allergic rhinitis due to pollen: Secondary | ICD-10-CM

## 2017-04-09 DIAGNOSIS — J309 Allergic rhinitis, unspecified: Secondary | ICD-10-CM | POA: Insufficient documentation

## 2017-04-09 MED ORDER — CETIRIZINE HCL 10 MG PO TABS: 10.0000 mg | ORAL_TABLET | Freq: Every day | ORAL | 11 refills | Status: DC

## 2017-04-09 MED ORDER — SUVOREXANT 10 MG PO TABS: 1.0000 | ORAL_TABLET | Freq: Every day | ORAL | 3 refills | Status: DC

## 2017-04-09 NOTE — Assessment & Plan Note (Signed)
Ongoing issue.  Pt did not find relief w/ Trazodone.  She has already failed melatonin.  Very interested in Belsomra but insurance denied this at last visit.  Will attempt to re-prescribe.  If again declined, will write appeal letter.

## 2017-04-09 NOTE — Progress Notes (Signed)
   Subjective:    Patient ID: Sharon [name], female    DOB: [birth year], 37 y.o.   MRN: [id number]  HPI Insomnia- pt started the Trazodone and reports that she 'might be waking up 2-3x less per night' but still having very frequent arousals and poor sleep.  This is the 2nd medication she has failed- the 1st being melatonin.  Allergic Rhinitis- pt reports ongoing nasal congestion, worse at night.  Able to breathe during the day but then having difficulty in the evenings.  No fevers.  No discolored drainage.  Review of Systems For ROS see HPI     Objective:   Physical Exam  Constitutional: She is oriented to person, place, and time. She appears well-developed and well-nourished. No distress.  HENT:  Head: Normocephalic and atraumatic.  Right Ear: Tympanic membrane normal.  Left Ear: Tympanic membrane normal.  Nose: Mucosal edema and rhinorrhea present. Right sinus exhibits no maxillary sinus tenderness and no frontal sinus tenderness. Left sinus exhibits no maxillary sinus tenderness and no frontal sinus tenderness.  Mouth/Throat: Mucous membranes are normal. Posterior oropharyngeal erythema (w/ PND) present.  Eyes: Conjunctivae and EOM are normal. Pupils are equal, round, and reactive to light.  Neck: Normal range of motion. Neck supple.  Pulmonary/Chest: Effort normal. No respiratory distress.  Neurological: She is alert and oriented to person, place, and time.  Skin: Skin is warm and dry.  Psychiatric: She has a normal mood and affect. Her behavior is normal. Thought content normal.  Vitals reviewed.         Assessment & Plan:

## 2017-04-09 NOTE — Patient Instructions (Signed)
Schedule your complete physical in 6 months We're going to try the Belsomra again START the Zyrtec daily Drink plenty of fluids Call with any questions or concerns Have a great weekend!!!

## 2017-04-09 NOTE — Assessment & Plan Note (Signed)
New.  Pt's sxs and PE consistent w/ seasonal allergies rather than infxn.  Start daily antihistamine.  Reviewed supportive care and red flags that should prompt return.  Pt expressed understanding and is in agreement w/ plan.

## 2017-04-15 DIAGNOSIS — F341 Dysthymic disorder: Secondary | ICD-10-CM | POA: Diagnosis not present

## 2017-04-25 ENCOUNTER — Encounter: Payer: Self-pay | Admitting: Family Medicine

## 2017-04-27 MED ORDER — SUVOREXANT 10 MG PO TABS: 1.0000 | ORAL_TABLET | Freq: Every day | ORAL | 3 refills | Status: DC

## 2017-04-29 DIAGNOSIS — F341 Dysthymic disorder: Secondary | ICD-10-CM | POA: Diagnosis not present

## 2017-05-07 ENCOUNTER — Telehealth: Payer: Self-pay | Admitting: Family Medicine

## 2017-05-07 ENCOUNTER — Encounter: Payer: Self-pay | Admitting: Family Medicine

## 2017-05-07 NOTE — Telephone Encounter (Signed)
Copied from CRM [phone]. Topic: Inquiry >> May 07, 2017  3:21 PM Sharon KalataMichael, Faatimah Spielberg L, NT wrote: Patient is calling and states her pharmacy called and they have tried to contact the office 3 times for a prior authorization. Please advise.  Suvorexant (BELSOMRA) 10 MG TABS

## 2017-05-08 NOTE — Telephone Encounter (Signed)
Called and advised pt that insurance will not let us try to push PA through. Dr. Beverely Lowabori wrote an appeal letter that was faxed off already.

## 2017-05-13 ENCOUNTER — Other Ambulatory Visit: Payer: Self-pay | Admitting: Family Medicine

## 2017-05-13 DIAGNOSIS — F341 Dysthymic disorder: Secondary | ICD-10-CM | POA: Diagnosis not present

## 2017-05-13 MED ORDER — TEMAZEPAM 15 MG PO CAPS: ORAL_CAPSULE | ORAL | 1 refills | Status: DC

## 2017-05-13 NOTE — Telephone Encounter (Signed)
Please advise 

## 2017-05-13 NOTE — Telephone Encounter (Signed)
Called patient and left a detailed message to advise of PCP recommendations and also advised that medication was filled to local pharmacy.

## 2017-05-13 NOTE — Telephone Encounter (Signed)
I wrote the appeal for her Belsomra- we're waiting on the response.  We could do a low dose Temazepam (Restoril) 15mg  nightly, #30, 1 refill.  This is in the same family as Alprazolam but not as habit forming.  She can start w/ 1/2 tab and then increase to 1 tab if needed

## 2017-05-13 NOTE — Telephone Encounter (Signed)
Copied from CRM [phone]. Topic: Quick Communication - See Telephone Encounter >> May 13, 2017 10:39 AM Floria RavelingStovall, Shana A wrote: CRM for notification. See Telephone encounter for: 05/13/17. Pt called in and stated that her gyn gave her xanex years ago and it did work for her.  She would like to know if this would be a option.  She took it back in 2013.  Please advise

## 2017-05-14 MED ORDER — TEMAZEPAM 15 MG PO CAPS
15.0000 mg | ORAL_CAPSULE | Freq: Every evening | ORAL | 0 refills | Status: DC | PRN
Start: 2017-05-14 — End: 2017-07-08

## 2017-05-14 NOTE — Telephone Encounter (Signed)
I am resending this back to you. I had copied the SIG take 1/2 to 1 tablet daily, The pharmacy alerted me that this was only available in capsule form. Ok to change SIG to take 1 capsule by mouth qhs for sleep?

## 2017-05-14 NOTE — Addendum Note (Signed)
Addended by: Geannie RisenBRODMERKEL, Breann Losano L on: 05/14/2017 09:55 AM   Modules accepted: Orders

## 2017-05-21 ENCOUNTER — Encounter: Payer: Self-pay | Admitting: Family Medicine

## 2017-05-26 ENCOUNTER — Telehealth: Payer: Self-pay | Admitting: Family Medicine

## 2017-05-26 NOTE — Telephone Encounter (Signed)
Copied from CRM [phone]. Topic: Quick Communication - See Telephone Encounter >> May 26, 2017 11:05 AM Terisa Starraylor, Brittany L wrote: CRM for notification. See Telephone encounter for: 05/26/17.  [facility] and said that [facility] sent over a form for a Prior Autho for Suvorexant [facility](BELSOMRA) 10 MG TABS on 4/8/, 4/9, 4/10, 4/11. He said that it is not going through Call back is [phone]

## 2017-05-26 NOTE — Telephone Encounter (Signed)
Called pt and left a detailed message. When trying to complete PA's from the pharmacy we are advised that this has been attempted and it would not be able to be completed.   I tried to manually create PA with the insurance card on file [address] [employer]. However, I received a note back from the insurance company that "they cannot identify this member as participating in a plan with active coverage/this patient does not have active pharmacy benefits"  Called pt to find out if we could get a copy of her current insurance card.

## 2017-05-27 ENCOUNTER — Encounter: Payer: Self-pay | Admitting: Family Medicine

## 2017-05-27 NOTE — Telephone Encounter (Signed)
Pt called to advise she sent current insurance Rx card through [employer] message. Pt states her regular insurance card is the only one she has every used for scripts, so she doesn't know what might be wrong.

## 2017-05-27 NOTE — Telephone Encounter (Signed)
Insurance card printed and placed in chart.

## 2017-05-29 ENCOUNTER — Telehealth: Payer: Self-pay | Admitting: Family Medicine

## 2017-05-29 NOTE — Telephone Encounter (Signed)
Copied from CRM [id number]. Topic: Quick Communication - See Telephone Encounter >> May 29, 2017  8:17 AM Windy KalataMichael, Maddoxx Burkitt L, NT wrote: Reason for CRM: patient states she received another rejection later in the mail from the medication Suvorexant (BELSOMRA) 10 MG TABS. She states she is at the point she is willing to take whatever the insurance company is recommending. She states her sister takes Lexapro and she likes it and is willing to try that. Please advise.

## 2017-06-01 NOTE — Telephone Encounter (Signed)
Called patient and left a detailed voice message to let her know that she can call us back to make an appointment to be seen for evaluation for alternative to Belsomra or she can wait until Dr. Beverely Lowabori comes back next week and can follow up next week to see if Dr. Karie Schwalbe can adjust her medication.  CRM created in case patient calls back.

## 2017-06-01 NOTE — Telephone Encounter (Signed)
That will be up to PCP discretion as I have not evaluated patient. Please let her know Dr. Karie Schwalbe will return next week and can give further input. I am happy to see her to discuss further if she would like this addressed this week.

## 2017-06-01 NOTE — Telephone Encounter (Signed)
Called patient and let her know that we had received her message and I do not see a response on the PA for Belsomra but let her know that I would reach out to our providers her for her questions.  Patient stated that she received a letter in the mail about the preferred medications other than Belsomra and she will take a picture and send to us in a MyChart message.   Patient also stated that her sister takes Lexapro for anxiety and sleep and it works well for her sister and she is wondering if this will work well for her?   Please advise.

## 2017-06-04 NOTE — Telephone Encounter (Signed)
Received a fax from local pharmacy that advised the preferred alternatives to Belsomra are: rozerem 8mg , zolpidem 6.25mg  or 10mg , and eszopiclone 1mg .   Please advise?

## 2017-06-07 NOTE — Telephone Encounter (Signed)
If pt is not struggling w/ anxiety or depression, Lexapro will not help the situation.  Please ask her if anxiety or depression is an issue for her.  If it is, we can start a daily SSRI (such as Lexapro  daily).  If it's not, I would recommend Zolpidem , 1/2-1 tab nightly prn sleep, #30 and schedule an appt in 1 month (either way)

## 2017-06-08 ENCOUNTER — Encounter: Payer: Self-pay | Admitting: *Deleted

## 2017-06-08 MED ORDER — ZOLPIDEM TARTRATE 10 MG PO TABS
ORAL_TABLET | ORAL | 1 refills | Status: DC
Start: 2017-06-08 — End: 2017-07-08

## 2017-06-08 NOTE — Telephone Encounter (Signed)
Pt returning call to the office and states she is not currently having issues with anxiety or depression. Pt is agreeable to start Zolpidem  at this time. [facility] [address] Appt scheduled for 5/29 @ 9:30am.

## 2017-06-08 NOTE — Telephone Encounter (Signed)
Called pt and left a detailed message advising of PCP recommendations. Will await return call to let us know how to proceed.   Ok for [facility] to Discuss results / PCP recommendations / Schedule patient.

## 2017-06-08 NOTE — Telephone Encounter (Signed)
This encounter was created in error - please disregard.

## 2017-06-10 DIAGNOSIS — H16223 Keratoconjunctivitis sicca, not specified as Sjogren's, bilateral: Secondary | ICD-10-CM | POA: Diagnosis not present

## 2017-06-19 ENCOUNTER — Encounter: Payer: Self-pay | Admitting: Family Medicine

## 2017-06-21 ENCOUNTER — Encounter: Payer: Self-pay | Admitting: Family Medicine

## 2017-06-22 MED ORDER — SUVOREXANT 10 MG PO TABS: 1.0000 | ORAL_TABLET | Freq: Every day | ORAL | 3 refills | Status: DC

## 2017-06-24 DIAGNOSIS — F341 Dysthymic disorder: Secondary | ICD-10-CM | POA: Diagnosis not present

## 2017-06-30 ENCOUNTER — Encounter: Payer: Self-pay | Admitting: Family Medicine

## 2017-07-04 ENCOUNTER — Other Ambulatory Visit: Payer: Self-pay | Admitting: Family Medicine

## 2017-07-08 ENCOUNTER — Other Ambulatory Visit: Payer: Self-pay

## 2017-07-08 ENCOUNTER — Ambulatory Visit (INDEPENDENT_AMBULATORY_CARE_PROVIDER_SITE_OTHER): Payer: BLUE CROSS/BLUE SHIELD | Admitting: Family Medicine

## 2017-07-08 ENCOUNTER — Encounter: Payer: Self-pay | Admitting: Family Medicine

## 2017-07-08 VITALS — BP 106/78 | HR 65 | Temp 98.1°F | Resp 16 | Ht 66.0 in | Wt 152.1 lb

## 2017-07-08 DIAGNOSIS — G479 Sleep disorder, unspecified: Secondary | ICD-10-CM | POA: Diagnosis not present

## 2017-07-08 MED ORDER — TRAZODONE HCL 100 MG PO TABS: 100.0000 mg | ORAL_TABLET | Freq: Every day | ORAL | 3 refills | Status: DC

## 2017-07-08 NOTE — Progress Notes (Signed)
   Subjective:    Patient ID: Sharon [name], female    DOB: December 16, 1980, 37 y.o.   MRN: [id number]  HPI Insomnia- pt tried and failed Belsomra both  and  doses.  No improvement in sleep.  No improvement w/ Temazepam.  Pt is open to idea of sleep specialist.  Pt reports that  Trazodone made a small improvement in the frequency of waking.  Can increase to  nightly.  Able to fall asleep easily but very difficult to stay asleep.   Review of Systems For ROS see HPI     Objective:   Physical Exam  Constitutional: She is oriented to person, place, and time. She appears well-developed and well-nourished. No distress.  HENT:  Head: Normocephalic and atraumatic.  Neurological: She is alert and oriented to person, place, and time. No cranial nerve deficit.  Skin: Skin is warm and dry.  Psychiatric: She has a normal mood and affect. Her behavior is normal. Thought content normal.  Vitals reviewed.         Assessment & Plan:  Disordered sleep- ongoing issue for pt.  She has tried multiple medications and continues to have frequent nightly awakenings.  No relief w/ melatonin, belsomra, temazepam.  Trazodone has been most effective but still ineffective- waking 8-10x/night.  Increase dose to  nightly.  Will follow.

## 2017-07-08 NOTE — Patient Instructions (Signed)
Follow up as needed or as scheduled We'll call you with your sleep referral appt START the  Trazodone nightly- new script sent Call with any questions or concerns Hang in there!!!

## 2017-07-29 ENCOUNTER — Encounter: Payer: Self-pay | Admitting: Family Medicine

## 2017-07-29 DIAGNOSIS — F341 Dysthymic disorder: Secondary | ICD-10-CM | POA: Diagnosis not present

## 2017-07-30 DIAGNOSIS — H00021 Hordeolum internum right upper eyelid: Secondary | ICD-10-CM | POA: Diagnosis not present

## 2017-08-05 DIAGNOSIS — F341 Dysthymic disorder: Secondary | ICD-10-CM | POA: Diagnosis not present

## 2017-09-02 DIAGNOSIS — F341 Dysthymic disorder: Secondary | ICD-10-CM | POA: Diagnosis not present

## 2017-09-23 ENCOUNTER — Ambulatory Visit: Payer: BLUE CROSS/BLUE SHIELD | Admitting: Neurology

## 2017-09-23 ENCOUNTER — Encounter: Payer: Self-pay | Admitting: Neurology

## 2017-09-23 VITALS — BP 118/70 | HR 69 | Ht 66.75 in | Wt 153.0 lb

## 2017-09-23 DIAGNOSIS — F341 Dysthymic disorder: Secondary | ICD-10-CM | POA: Diagnosis not present

## 2017-09-23 DIAGNOSIS — G4752 REM sleep behavior disorder: Secondary | ICD-10-CM | POA: Diagnosis not present

## 2017-09-23 DIAGNOSIS — G4753 Recurrent isolated sleep paralysis: Secondary | ICD-10-CM | POA: Insufficient documentation

## 2017-09-23 DIAGNOSIS — G4719 Other hypersomnia: Secondary | ICD-10-CM | POA: Diagnosis not present

## 2017-09-23 DIAGNOSIS — G478 Other sleep disorders: Secondary | ICD-10-CM
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
## 2017-09-23 NOTE — Patient Instructions (Signed)
  Please remember to try to maintain good sleep hygiene, which means: Keep a regular sleep and wake schedule, try not to exercise or have a meal within 2 hours of your bedtime, try to keep your bedroom conducive for sleep, that is, cool and dark, without light distractors such as an illuminated alarm clock, and refrain from watching TV right before sleep or in the middle of the night and do not keep the TV or radio on during the night. Also, try not to use or play on electronic devices at bedtime, such as your cell phone, tablet PC or laptop. If you like to read at bedtime on an electronic device, try to dim the background light as much as possible. Do not eat in the middle of the night.    For chronic insomnia, you are best followed by a psychiatrist and/or sleep psychologist.   We will call you with the sleep study results and make a follow up appointment if needed.    

## 2017-09-23 NOTE — Addendum Note (Signed)
Addended by: Melvyn NovasHMEIER, Tekila Caillouet on: 09/23/2017 11:07 AM   Modules accepted: Orders

## 2017-10-21 DIAGNOSIS — F341 Dysthymic disorder: Secondary | ICD-10-CM | POA: Diagnosis not present

## 2017-11-04 ENCOUNTER — Ambulatory Visit (INDEPENDENT_AMBULATORY_CARE_PROVIDER_SITE_OTHER): Payer: BLUE CROSS/BLUE SHIELD | Admitting: Neurology

## 2017-11-04 DIAGNOSIS — G4752 REM sleep behavior disorder: Secondary | ICD-10-CM | POA: Diagnosis not present

## 2017-11-04 DIAGNOSIS — G4719 Other hypersomnia: Secondary | ICD-10-CM

## 2017-11-04 DIAGNOSIS — G4753 Recurrent isolated sleep paralysis: Secondary | ICD-10-CM

## 2017-11-04 DIAGNOSIS — G478 Other sleep disorders: Secondary | ICD-10-CM

## 2017-11-05 ENCOUNTER — Other Ambulatory Visit: Payer: Self-pay | Admitting: Neurology

## 2017-11-05 ENCOUNTER — Ambulatory Visit (INDEPENDENT_AMBULATORY_CARE_PROVIDER_SITE_OTHER): Payer: BLUE CROSS/BLUE SHIELD | Admitting: Neurology

## 2017-11-05 DIAGNOSIS — G4752 REM sleep behavior disorder: Secondary | ICD-10-CM

## 2017-11-05 DIAGNOSIS — G478 Other sleep disorders: Secondary | ICD-10-CM

## 2017-11-05 DIAGNOSIS — G4719 Other hypersomnia: Secondary | ICD-10-CM | POA: Diagnosis not present

## 2017-11-05 DIAGNOSIS — G4753 Recurrent isolated sleep paralysis: Secondary | ICD-10-CM | POA: Diagnosis not present

## 2017-11-05 NOTE — Addendum Note (Signed)
Addended by: Tamera Stands D on: 11/05/2017 01:22 PM   Modules accepted: Orders

## 2017-11-11 DIAGNOSIS — F341 Dysthymic disorder: Secondary | ICD-10-CM | POA: Diagnosis not present

## 2017-11-11 LAB — NARCOLEPSY EVALUATION
DQA1*01:02: NEGATIVE
DQB1*06:02: NEGATIVE

## 2017-11-11 LAB — COMPREHENSIVE DRUG ANALYSIS,UR

## 2017-11-11 NOTE — Procedures (Signed)
  Name:  Sharon [name], Sharon [name] Reference [id number]  Study Date: 11/05/2017 Procedure #: 2510  DOB: 1980-10-31    Protocol  This is a 13 channel Multiple Sleep Latency Test comprised of 5 channels of EEG (T3-Cz, Cz-T4, F4-M1, C4-M1, O2-M1), 3 channels of Chin EMG, 4 channels of EOG and 1 channel for ECG.   All channels were sampled at 256hz .    This polysomnographic procedure is designed to evaluate (1) the complaint of excessive daytime sleepiness by quantifying the time required to fall asleep and (2) the possibility of narcolepsy by checking for abnormally short latencies to REM sleep.  Electrographic variables include EEG, EMG, EOG and ECG.  Patients are monitored throughout four or five 20-minute opportunities to sleep (naps) at two-hour intervals.  For each nap, the patient is allowed 20 minutes to fall asleep.  Once asleep, the patient is awakened after 15 minutes.  Between naps, the patient is kept as alert as possible.    A sleep latency of 20 minutes indicates that no sleep occurred.  Parametric Analysis  Total Number of Naps 5     NAP # Time of Nap  Sleep Latency (mins) REM Latency (mins) Sleep Time Percent Awake Time Percent  1 07:44 20 0 0 100   2 09:36 12 0 58  42   3 11:32 13 0 22  78   4 13:28 14.5 0 55  45   5 15:28 20 0 0  100    MSLT Summary of Naps  Sleepiness Index: 20.5  Mean Sleep Latency to all Five Naps: 15.9  Mean Sleep Latency to First Four Naps: 14.9  Mean Sleep Latency to First Three Naps: 15  Mean Sleep Latency to First Two Naps: 16  Number of Naps with REM Sleep: 0    Results from Preceding PSG Study  Sleep Onset Time 22:28 Sleep Efficiency (%) 88.2  Rise Time 06:09 Sleep Latency (min) 34  Total Sleep Time  7.3hrs REM Latency (min) 155      Name:  [name], Sharon [name] Reference #:  [id number]  Study Date: 11/05/2017 DOB: 1980/11/20   IMPRESSION:  1. This multiple sleep latency test reveals a mean sleep latency of 15.9 minutes with only 3 out of 5  sleep periods during which sleep was recorded.  A total of zero REM sleep periods were recorded.    RECOMMENDATIONS: This is a normal MSLT study. 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 This study result is not consistent with a diagnosis of narcolepsy.    I attest to having reviewed every epoch of the entire raw data recording prior to the issuance of this report in accordance with the Standards of the American Academy of Sleep Medicine.     Melvyn Novas, M.D.     11-11-2017  Diplomat, American Board of Psychiatry and Neurology  Diplomat, American Board of Sleep Medicine Wellsite geologist, [place] [facility]

## 2017-11-11 NOTE — Procedures (Signed)
PATIENT'S NAME:  Sharon [name], Sharon [name] DOB:      14-Dec-1980      MR#:    [id number]     DATE OF RECORDING: 11/04/2017 REFERRING M.D.:  Neena Rhymes, MD Study Performed:   Baseline Polysomnogram for MSLT to follow HISTORY:  Sharon [name] is a 37 y.o. female seen for chronic insomnia.   Chief complaint according to patient: "I have had insomnia since my father's death when I was 17 ". She reports her father had a fatal lung cancer condition, he paced the room above hers at night and she would wake up- reported she "became a light sleeper "- she is tearful as she reports this. Later, in college, she had a roommate with a seizure disorder from 2003-2005 and reports that she was on the outlook at night, she felt responsible, making sure that the roommate had not fallen out of the lower bunk bed.   Sleep habits are as follows: she eats dinner 8-9 o'clock, her husband is a Education officer, environmental and she waits often with the meal. She reads a kindle in bed, keeps the bedroom cool, quiet and dark, and the kids do not sleep in the same room.  She however listens into her surroundings and is aware when her children need her. Her bedtime is between 10.30 PM and midnight.  She has vivid dreams, and is woken often between 1.30 AM to 4 AM, and she rises usually at 6.30 AM with her 36 month old.    The patient endorsed the Epworth Sleepiness Scale at 15/24 points, FSS at 27/ 63 points.   The patient's weight 152 pounds with a height of 66 (inches), resulting in a BMI of 24.4 kg/m2. The patient's neck circumference measured 13 inches.  CURRENT MEDICATIONS: Desyrel- which was weaned prior to sleep study   PROCEDURE:  This is a multichannel digital polysomnogram utilizing the Somnostar 11.2 system.  Electrodes and sensors were applied and monitored per AASM Specifications.   EEG, EOG, Chin and Limb EMG, were sampled at 200 Hz.  ECG, Snore and Nasal Pressure, Thermal Airflow, Respiratory Effort, CPAP Flow and Pressure, Oximetry was sampled  at 50 Hz. Digital video and audio were recorded.      BASELINE STUDY: Lights Out was at 21:54 and Lights On at 06:09.  Total recording time (TRT) was 495.5 minutes, with a total sleep time (TST) of 437 minutes.   The patient's sleep latency was 34 minutes.  REM latency was 155 minutes.  The sleep efficiency was 88.2 %.     SLEEP ARCHITECTURE: WASO (Wake after sleep onset) was 24.5 minutes.  There were 14.5 minutes in Stage N1, 209.5 minutes Stage N2, 126.5 minutes Stage N3 and 86.5 minutes in Stage REM.  The percentage of Stage N1 was 3.3%, Stage N2 was 47.9%, Stage N3 was 28.9% and Stage R (REM sleep) was 19.8%.  The arousals were noted as: 44 were spontaneous, 0 were associated with PLMs, and 0 were associated with respiratory events.  RESPIRATORY ANALYSIS:  There were a total of 0 respiratory events:  0 obstructive apneas, 0 central apneas and 0 mixed apneas with a total of 0 apneas and an apnea index (AI) of 0 /hour. There were 0 hypopneas with a hypopnea index of 0 /hour. The patient also had 0 respiratory event related arousals (RERAs).      The total APNEA/HYPOPNEA INDEX (AHI) was 0 /hour and the total RESPIRATORY DISTURBANCE INDEX was 0. 0 /hour. The patient spent 237.5 minutes of total  sleep time in the supine position and 200 minutes in non-supine. The supine AHI was 0.0/h versus a non-supine AHI of 0.0.  OXYGEN SATURATION & C02:  The Wake baseline 02 saturation was 96%, with the lowest being 82%. Time spent below 89% saturation equaled 21 minutes.    PERIODIC LIMB MOVEMENTS:  The patient had a total of 0 Periodic Limb Movements.  High sleep efficiency with 3 REM sleep phases.  Audio and video analysis did not show any abnormal or unusual movements, behaviors, phonations or vocalizations.   The patient took one bathroom break. No Snoring was noted. EKG was in keeping with normal sinus rhythm (NSR).  Post-study, the patient indicated that sleep was the same as usual.     IMPRESSION:  1. Valid test for MSLT to follow.    I certify that I have reviewed the entire raw data recording prior to the issuance of this report in accordance with the Standards of Accreditation of the American Academy of Sleep Medicine (AASM)     Melvyn Novas, MD  11-11-2017 Diplomat, American Board of Psychiatry and Neurology  Diplomat, American Board of Sleep Medicine Wellsite geologist, [facility]

## 2017-11-12 ENCOUNTER — Telehealth: Payer: Self-pay

## 2017-11-12 ENCOUNTER — Encounter: Payer: Self-pay | Admitting: Family Medicine

## 2017-11-12 NOTE — Telephone Encounter (Signed)
-----   Message from Melvyn Novas, MD sent at 11/11/2017 12:18 PM EDT ----- Not positive for narcolepsy- The preceding sleep study at night was normal, too.  May be this is a case of sleep deprivation, light sleep in the home environment listening in with her young children / hypervigilance.

## 2017-11-12 NOTE — Telephone Encounter (Signed)
Pt returned my call. I advised her of her sleep study results and lab results. I offered her an appt with Dr. Vickey Huger to discuss her sleepiness and results further but pt declined and will follow up with Dr. Beverely Low. Pt verbalized understanding of results. Pt had no questions at this time but was encouraged to call back if questions arise.

## 2017-11-12 NOTE — Telephone Encounter (Signed)
-----   Message from Melvyn Novas, MD sent at 11/11/2017  4:33 PM EDT ----- Negative testing for two alleles of the narcolepsy trait.

## 2017-11-12 NOTE — Telephone Encounter (Signed)
-----   Message from Melvyn Novas, MD sent at 11/11/2017 12:18 PM EDT ----- Negative tox screen

## 2017-11-12 NOTE — Telephone Encounter (Signed)
-----   Message from Melvyn Novas, MD sent at 11/11/2017  8:55 AM EDT ----- Very normal PSG for MSLT to follow.

## 2017-11-12 NOTE — Telephone Encounter (Signed)
I called pt to discuss her results. No answer, left a message asking her to call me back. 

## 2017-11-16 DIAGNOSIS — F341 Dysthymic disorder: Secondary | ICD-10-CM | POA: Diagnosis not present

## 2017-11-18 DIAGNOSIS — F341 Dysthymic disorder: Secondary | ICD-10-CM | POA: Diagnosis not present

## 2017-12-02 DIAGNOSIS — F341 Dysthymic disorder: Secondary | ICD-10-CM | POA: Diagnosis not present

## 2017-12-16 DIAGNOSIS — F341 Dysthymic disorder: Secondary | ICD-10-CM | POA: Diagnosis not present

## 2017-12-30 DIAGNOSIS — F341 Dysthymic disorder: Secondary | ICD-10-CM | POA: Diagnosis not present

## 2018-01-20 DIAGNOSIS — F341 Dysthymic disorder: Secondary | ICD-10-CM | POA: Diagnosis not present

## 2018-01-27 DIAGNOSIS — F341 Dysthymic disorder: Secondary | ICD-10-CM | POA: Diagnosis not present

## 2018-02-17 DIAGNOSIS — F341 Dysthymic disorder: Secondary | ICD-10-CM | POA: Diagnosis not present

## 2018-03-03 DIAGNOSIS — F341 Dysthymic disorder: Secondary | ICD-10-CM | POA: Diagnosis not present

## 2018-03-04 ENCOUNTER — Encounter: Payer: Self-pay | Admitting: Family Medicine

## 2018-03-05 MED ORDER — OSELTAMIVIR PHOSPHATE 75 MG PO CAPS: 75.0000 mg | ORAL_CAPSULE | Freq: Every day | ORAL | 0 refills | Status: DC

## 2018-03-15 DIAGNOSIS — F341 Dysthymic disorder: Secondary | ICD-10-CM | POA: Diagnosis not present

## 2018-03-17 DIAGNOSIS — F341 Dysthymic disorder: Secondary | ICD-10-CM | POA: Diagnosis not present

## 2018-03-24 DIAGNOSIS — F341 Dysthymic disorder: Secondary | ICD-10-CM | POA: Diagnosis not present

## 2018-03-31 DIAGNOSIS — F341 Dysthymic disorder: Secondary | ICD-10-CM | POA: Diagnosis not present

## 2018-04-05 DIAGNOSIS — J111 Influenza due to unidentified influenza virus with other respiratory manifestations: Secondary | ICD-10-CM | POA: Diagnosis not present

## 2018-04-14 DIAGNOSIS — F341 Dysthymic disorder: Secondary | ICD-10-CM | POA: Diagnosis not present

## 2018-04-28 ENCOUNTER — Encounter: Payer: Self-pay | Admitting: Family Medicine

## 2018-05-05 DIAGNOSIS — F341 Dysthymic disorder: Secondary | ICD-10-CM | POA: Diagnosis not present

## 2018-05-14 DIAGNOSIS — F341 Dysthymic disorder: Secondary | ICD-10-CM | POA: Diagnosis not present

## 2018-06-04 DIAGNOSIS — F341 Dysthymic disorder: Secondary | ICD-10-CM | POA: Diagnosis not present

## 2018-06-08 ENCOUNTER — Encounter: Payer: Self-pay | Admitting: Family Medicine

## 2018-06-09 ENCOUNTER — Other Ambulatory Visit: Payer: Self-pay

## 2018-06-09 ENCOUNTER — Encounter: Payer: Self-pay | Admitting: Family Medicine

## 2018-06-09 ENCOUNTER — Ambulatory Visit (INDEPENDENT_AMBULATORY_CARE_PROVIDER_SITE_OTHER): Payer: BLUE CROSS/BLUE SHIELD | Admitting: Family Medicine

## 2018-06-09 VITALS — Temp 98.6°F | Ht 66.75 in | Wt 154.0 lb

## 2018-06-09 DIAGNOSIS — F321 Major depressive disorder, single episode, moderate: Secondary | ICD-10-CM

## 2018-06-09 MED ORDER — ESCITALOPRAM OXALATE 10 MG PO TABS: 10.0000 mg | ORAL_TABLET | Freq: Every day | ORAL | 3 refills | Status: DC

## 2018-06-09 NOTE — Progress Notes (Signed)
I have discussed the procedure for the virtual visit with the patient who has given consent to proceed with assessment and treatment.   BETHANY DILLARD, CMA     

## 2018-06-09 NOTE — Progress Notes (Signed)
   Virtual Visit via Video   I connected with patient on 06/09/18 at  3:00 PM EDT by a video enabled telemedicine application and verified that I am speaking with the correct person using two identifiers.  Location patient: Home Location provider: [place], [facility] Persons participating in the virtual visit: Patient, Provider, CMA [facility])  I discussed the limitations of evaluation and management by telemedicine and the availability of in person appointments. The patient expressed understanding and agreed to proceed.  Subjective:   HPI:   Depression- pt reports her 'coping skills aren't available'.  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  Work stresses have increased.  Pt has a lot of crisis management at work which is exhausting to her.  Decreased energy, low motivation.  Increased nightmares.  Pt has never been on meds before.  Seeing therapist 'on a pretty regular basis'.  Denies SI/HI  ROS:   See pertinent positives and negatives per HPI.  Patient Active Problem List   Diagnosis Date Noted  . 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 Excessive daytime sleepiness 09/23/2017  . 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 Allergic rhinitis 04/09/2017  . Hx of gestational diabetes mellitus, not currently pregnant 03/11/2017  . Overweight (BMI 25.0-29.9) 03/11/2017  . Disordered sleep 03/11/2017    Social History   Tobacco Use  . Smoking status: Never Smoker  . Smokeless tobacco: Never Used  Substance Use Topics  . Alcohol use: Yes    Alcohol/week: 2.0 - 10.0 standard drinks    Types: 2 - 10 Glasses of wine per week    Current Outpatient Medications:  .  b complex vitamins tablet, Take 1 tablet by mouth daily., Disp: , Rfl:  .  doxylamine, Sleep, (UNISOM) 25 MG tablet, Take 25 mg by mouth at bedtime as needed., Disp: , Rfl:  .  Vitamin D, Cholecalciferol, 1000 units CAPS, Take by mouth., Disp: , Rfl:  .  oseltamivir (TAMIFLU) 75 MG capsule, Take 1  capsule (75 mg total) by mouth daily., Disp: 10 capsule, Rfl: 0  No Known Allergies  Objective:   Temp 98.6 F (37 C)   Ht 5' 6.75" (1.695 m)   Wt 154 lb (69.9 kg)   BMI 24.30 kg/m   AAOx3, NAD NCAT, EOMI No obvious CN deficits Coloring WNL Pt is able to speak clearly, coherently without shortness of breath or increased work of breathing.  Thought process is linear.  Mood is appropriate.   Assessment and Plan:   Depression- new.  Pt has had depressive tendencies in the past but nothing that has lasted or required medication.  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  Will start Lexapro 10mg  daily and monitor closely for improvement in depressive sxs.  Pt expressed understanding and is in agreement w/ plan.    Neena Rhymes, MD 06/09/2018

## 2018-06-16 DIAGNOSIS — H16223 Keratoconjunctivitis sicca, not specified as Sjogren's, bilateral: Secondary | ICD-10-CM | POA: Diagnosis not present

## 2018-07-12 ENCOUNTER — Encounter: Payer: Self-pay | Admitting: Family Medicine

## 2018-07-12 ENCOUNTER — Other Ambulatory Visit: Payer: Self-pay

## 2018-07-12 ENCOUNTER — Ambulatory Visit (INDEPENDENT_AMBULATORY_CARE_PROVIDER_SITE_OTHER): Payer: BLUE CROSS/BLUE SHIELD | Admitting: Family Medicine

## 2018-07-12 VITALS — Temp 97.4°F | Ht 66.75 in | Wt 156.2 lb

## 2018-07-12 DIAGNOSIS — F339 Major depressive disorder, recurrent, unspecified: Secondary | ICD-10-CM

## 2018-07-12 MED ORDER — SERTRALINE HCL 50 MG PO TABS: 50.0000 mg | ORAL_TABLET | Freq: Every day | ORAL | 3 refills | Status: DC

## 2018-07-12 NOTE — Progress Notes (Signed)
   Virtual Visit via Video   I connected with patient on 07/12/18 at  9:40 AM EDT by a video enabled telemedicine application and verified that I am speaking with the correct person using two identifiers.  Location patient: Home Location provider: [facility], Office Persons participating in the virtual visit: Patient, Provider, CMA (Jess B)  I discussed the limitations of evaluation and management by telemedicine and the availability of in person appointments. The patient expressed understanding and agreed to proceed.  Subjective:   HPI:   Depression- pt was started on Lexapro at last visit.  Pt reports 'no positive change'.  Continues to have sadness and irritability.  Husband has not noticed any improvement in mood.  Pt tolerated addition of SSRI w/o difficulty or side effects.  ROS:   See pertinent positives and negatives per HPI.  Patient Active Problem List   Diagnosis Date Noted  . 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 Excessive daytime sleepiness 09/23/2017  . 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 Allergic rhinitis 04/09/2017  . Hx of gestational diabetes mellitus, not currently pregnant 03/11/2017  . Overweight (BMI 25.0-29.9) 03/11/2017  . Disordered sleep 03/11/2017    Social History   Tobacco Use  . Smoking status: Never Smoker  . Smokeless tobacco: Never Used  Substance Use Topics  . Alcohol use: Yes    Alcohol/week: 2.0 - 10.0 standard drinks    Types: 2 - 10 Glasses of wine per week    Current Outpatient Medications:  .  b complex vitamins tablet, Take 1 tablet by mouth daily., Disp: , Rfl:  .  doxylamine, Sleep, (UNISOM) 25 MG tablet, Take 25 mg by mouth at bedtime as needed., Disp: , Rfl:  .  escitalopram (LEXAPRO) 10 MG tablet, Take 1 tablet (10 mg total) by mouth daily., Disp: 30 tablet, Rfl: 3 .  Vitamin D, Cholecalciferol, 1000 units CAPS, Take by mouth., Disp: , Rfl:   No Known Allergies  Objective:   Temp (!) 97.4 F (36.3 C)  (Oral)   Ht 5' 6.75" (1.695 m)   Wt 156 lb 4 oz (70.9 kg)   BMI 24.66 kg/m  AAOx3, NAD NCAT, EOMI No obvious CN deficits Coloring WNL Pt is able to speak clearly, coherently without shortness of breath or increased work of breathing.  Thought process is linear.  Mood is appropriate.   Assessment and Plan:   Depression- no improvement w/ addition of Lexapro.  Given that there was no benefit, increasing the dose would likely not be effective.  Will switch to Sertraline and monitor closely for improvement.  Again reviewed possible side effects.  Will follow.   Neena Rhymes, MD 07/12/2018

## 2018-07-12 NOTE — Progress Notes (Signed)
I have discussed the procedure for the virtual visit with the patient who has given consent to proceed with assessment and treatment.   Sheng Pritz L Cj Edgell, CMA     

## 2018-07-13 DIAGNOSIS — F341 Dysthymic disorder: Secondary | ICD-10-CM | POA: Diagnosis not present

## 2018-07-26 ENCOUNTER — Encounter: Payer: Self-pay | Admitting: Family Medicine

## 2018-07-29 DIAGNOSIS — F341 Dysthymic disorder: Secondary | ICD-10-CM | POA: Diagnosis not present

## 2018-08-12 DIAGNOSIS — F341 Dysthymic disorder: Secondary | ICD-10-CM | POA: Diagnosis not present

## 2018-08-16 ENCOUNTER — Encounter: Payer: Self-pay | Admitting: Family Medicine

## 2018-08-16 ENCOUNTER — Ambulatory Visit (INDEPENDENT_AMBULATORY_CARE_PROVIDER_SITE_OTHER): Payer: BC Managed Care – PPO | Admitting: Family Medicine

## 2018-08-16 ENCOUNTER — Other Ambulatory Visit: Payer: Self-pay

## 2018-08-16 DIAGNOSIS — F419 Anxiety disorder, unspecified: Secondary | ICD-10-CM | POA: Diagnosis not present

## 2018-08-16 DIAGNOSIS — F32A Depression, unspecified: Secondary | ICD-10-CM

## 2018-08-16 DIAGNOSIS — F329 Major depressive disorder, single episode, unspecified: Secondary | ICD-10-CM | POA: Diagnosis not present

## 2018-08-16 MED ORDER — SERTRALINE HCL 100 MG PO TABS: 100.0000 mg | ORAL_TABLET | Freq: Every day | ORAL | 3 refills | Status: DC

## 2018-08-16 NOTE — Progress Notes (Signed)
   Virtual Visit via Video   I connected with patient on 08/16/18 at  4:00 PM EDT by a video enabled telemedicine application and verified that I am speaking with the correct person using two identifiers.  Location patient: Home Location provider: Acupuncturist, Office Persons participating in the virtual visit: Patient, Provider, [place] (Jess B)  I discussed the limitations of evaluation and management by telemedicine and the availability of in person appointments. The patient expressed understanding and agreed to proceed.  Subjective:   HPI:   Anxiety- pt was switched from Lexapro to Sertraline at last visit b/c Lexapro was ineffective.  Pt feels that 'things are going ok'.  Husband has not noticed a change.  Pt feels that she has been able to tell a difference in the last 8 days- 'i'm more patient'.  Sleeping better.  ROS:   See pertinent positives and negatives per HPI.  Patient Active Problem List   Diagnosis Date Noted  . 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 Excessive daytime sleepiness 09/23/2017  . 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 Allergic rhinitis 04/09/2017  . Hx of gestational diabetes mellitus, not currently pregnant 03/11/2017  . Overweight (BMI 25.0-29.9) 03/11/2017  . Disordered sleep 03/11/2017    Social History   Tobacco Use  . Smoking status: Never Smoker  . Smokeless tobacco: Never Used  Substance Use Topics  . Alcohol use: Yes    Alcohol/week: 2.0 - 10.0 standard drinks    Types: 2 - 10 Glasses of wine per week    Current Outpatient Medications:  .  b complex vitamins tablet, Take 1 tablet by mouth daily., Disp: , Rfl:  .  doxylamine, Sleep, (UNISOM) 25 MG tablet, Take 25 mg by mouth at bedtime as needed., Disp: , Rfl:  .  sertraline (ZOLOFT) 50 MG tablet, Take 1 tablet (50 mg total) by mouth daily., Disp: 30 tablet, Rfl: 3 .  Vitamin D, Cholecalciferol, 1000 units CAPS, Take by mouth., Disp: , Rfl:   No Known Allergies   Objective:   There were no vitals taken for this visit.  AAOx3, NAD NCAT, EOMI No obvious CN deficits Coloring WNL Pt is able to speak clearly, coherently without shortness of breath or increased work of breathing.  Thought process is linear.  Mood is appropriate.   Assessment and Plan:   Anxiety/depression- slowly improving.  Will increase Sertraline to 100mg  daily and continue to monitor for improvement.  Pt reports that her patience and sleep have both improved over the last week.  Hopefully the increased dose will continue to have benefit.  Pt expressed understanding and is in agreement w/ plan.    Annye Asa, MD 08/16/2018

## 2018-08-16 NOTE — Progress Notes (Signed)
I have discussed the procedure for the virtual visit with the patient who has given consent to proceed with assessment and treatment.   Pt thinks that she might benefit from increasing dosage. Not able to obtain vitals  Sharon [name], CMA

## 2018-08-19 DIAGNOSIS — F341 Dysthymic disorder: Secondary | ICD-10-CM | POA: Diagnosis not present

## 2018-08-25 DIAGNOSIS — F341 Dysthymic disorder: Secondary | ICD-10-CM | POA: Diagnosis not present

## 2018-09-08 DIAGNOSIS — F341 Dysthymic disorder: Secondary | ICD-10-CM | POA: Diagnosis not present

## 2018-09-21 ENCOUNTER — Telehealth: Payer: Self-pay | Admitting: *Deleted

## 2018-09-21 NOTE — Telephone Encounter (Signed)
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

## 2018-09-22 DIAGNOSIS — F341 Dysthymic disorder: Secondary | ICD-10-CM | POA: Diagnosis not present

## 2018-09-22 NOTE — Telephone Encounter (Signed)
She should not see any pts w/ the exception of virtually for the next 10-14 days since she has a known exposure.  We can discuss testing at her appt on Thursday

## 2018-09-22 NOTE — Telephone Encounter (Signed)
Patient notified of PCP recommendations and is agreement and expresses an understanding.   Ok for PEC to Discuss results / PCP recommendations / Schedule patient.   

## 2018-09-23 ENCOUNTER — Encounter: Payer: Self-pay | Admitting: Family Medicine

## 2018-09-23 ENCOUNTER — Ambulatory Visit (INDEPENDENT_AMBULATORY_CARE_PROVIDER_SITE_OTHER): Payer: BC Managed Care – PPO | Admitting: Family Medicine

## 2018-09-23 ENCOUNTER — Encounter (INDEPENDENT_AMBULATORY_CARE_PROVIDER_SITE_OTHER): Payer: Self-pay

## 2018-09-23 ENCOUNTER — Other Ambulatory Visit: Payer: Self-pay

## 2018-09-23 VITALS — Temp 98.3°F | Ht 66.75 in | Wt 163.0 lb

## 2018-09-23 DIAGNOSIS — F419 Anxiety disorder, unspecified: Secondary | ICD-10-CM

## 2018-09-23 DIAGNOSIS — Z20822 Contact with and (suspected) exposure to covid-19: Secondary | ICD-10-CM

## 2018-09-23 DIAGNOSIS — F329 Major depressive disorder, single episode, unspecified: Secondary | ICD-10-CM | POA: Diagnosis not present

## 2018-09-23 DIAGNOSIS — Z20828 Contact with and (suspected) exposure to other viral communicable diseases: Secondary | ICD-10-CM

## 2018-09-23 MED ORDER — BUPROPION HCL ER (XL) 150 MG PO TB24: 150.0000 mg | ORAL_TABLET | Freq: Every day | ORAL | 3 refills | Status: DC

## 2018-09-23 NOTE — Progress Notes (Signed)
I have discussed the procedure for the virtual visit with the patient who has given consent to proceed with assessment and treatment.   Pt feels as though zoloft is working, feels as though she does not feel it is working to the extent she is expecting. She is concerned that she has gained 13 lbs since she has been on the medications.   Davis Gourd, CMA

## 2018-09-23 NOTE — Progress Notes (Signed)
   Virtual Visit via Video   I connected with patient on 09/23/18 at  9:30 AM EDT by a video enabled telemedicine application and verified that I am speaking with the correct person using two identifiers.  Location patient: Home Location provider: Acupuncturist, Office Persons participating in the virtual visit: Patient, Provider, [place] (Jess B)  I discussed the limitations of evaluation and management by telemedicine and the availability of in person appointments. The patient expressed understanding and agreed to proceed.  Subjective:   HPI:  Anxiety/Depression- ongoing issue.  On Sertraline 100mg  daily.  'I feel like it's working but not to the level I want it to be working'.  + weight gain.  Pt reports eating habits have not changed.  Continues to exercise.  + COVID exposure- Monday for 53 minutes.  Not wearing masks, but 6 ft apart.  No coughing during the session.  Pt has been checking temp morning and night.  Has been having sinus congestion x4 weeks but nothing new.  No change to taste or smell.  No body aches.  ROS:   See pertinent positives and negatives per HPI.  Patient Active Problem List   Diagnosis Date Noted  . Anxiety and depression 08/16/2018  . 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 Excessive daytime sleepiness 09/23/2017  . 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 Allergic rhinitis 04/09/2017  . Hx of gestational diabetes mellitus, not currently pregnant 03/11/2017  . Overweight (BMI 25.0-29.9) 03/11/2017  . Disordered sleep 03/11/2017    Social History   Tobacco Use  . Smoking status: Never Smoker  . Smokeless tobacco: Never Used  Substance Use Topics  . Alcohol use: Yes    Alcohol/week: 2.0 - 10.0 standard drinks    Types: 2 - 10 Glasses of wine per week    Current Outpatient Medications:  .  b complex vitamins tablet, Take 1 tablet by mouth daily., Disp: , Rfl:  .  doxylamine, Sleep, (UNISOM) 25 MG tablet, Take 25 mg by mouth at bedtime as  needed., Disp: , Rfl:  .  sertraline (ZOLOFT) 100 MG tablet, Take 1 tablet (100 mg total) by mouth daily., Disp: 30 tablet, Rfl: 3 .  Vitamin D, Cholecalciferol, 1000 units CAPS, Take by mouth., Disp: , Rfl:   No Known Allergies  Objective:   Temp 98.3 F (36.8 C) (Oral)   Ht 5' 6.75" (1.695 m)   Wt 163 lb (73.9 kg)   BMI 25.72 kg/m   AAOx3, NAD NCAT, EOMI No obvious CN deficits Coloring WNL Pt is able to speak clearly, coherently without shortness of breath or increased work of breathing.  Thought process is linear.  Mood is appropriate.   Assessment and Plan:   Anxiety/Depression- improved since increasing Sertraline but she still feels there is room for improvement.  Given her concern for weight gain, will add Wellbutrin rather than pushing the Zoloft dose.  Pt expressed understanding and is in agreement w/ plan.   COVID exposure- new.  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  She is currently asymptomatic but would like to be able to tell clients or colleagues of possible exposure.  Enrolled in home monitoring program.  Will place order for testing.  Pt aware that she needs to go to [place], MD 09/23/2018

## 2018-09-23 NOTE — Addendum Note (Signed)
Addended by: Davis Gourd on: 09/23/2018 10:03 AM   Modules accepted: Orders

## 2018-09-24 ENCOUNTER — Encounter (INDEPENDENT_AMBULATORY_CARE_PROVIDER_SITE_OTHER): Payer: Self-pay

## 2018-09-24 LAB — NOVEL CORONAVIRUS, NAA: SARS-CoV-2, NAA: NOT DETECTED

## 2018-09-25 ENCOUNTER — Encounter (INDEPENDENT_AMBULATORY_CARE_PROVIDER_SITE_OTHER): Payer: Self-pay

## 2018-09-26 ENCOUNTER — Encounter (INDEPENDENT_AMBULATORY_CARE_PROVIDER_SITE_OTHER): Payer: Self-pay

## 2018-09-27 ENCOUNTER — Encounter (INDEPENDENT_AMBULATORY_CARE_PROVIDER_SITE_OTHER): Payer: Self-pay

## 2018-09-28 ENCOUNTER — Encounter (INDEPENDENT_AMBULATORY_CARE_PROVIDER_SITE_OTHER): Payer: Self-pay

## 2018-09-29 ENCOUNTER — Encounter (INDEPENDENT_AMBULATORY_CARE_PROVIDER_SITE_OTHER): Payer: Self-pay

## 2018-09-29 DIAGNOSIS — F341 Dysthymic disorder: Secondary | ICD-10-CM | POA: Diagnosis not present

## 2018-09-30 ENCOUNTER — Encounter (INDEPENDENT_AMBULATORY_CARE_PROVIDER_SITE_OTHER): Payer: Self-pay

## 2018-10-02 ENCOUNTER — Encounter (INDEPENDENT_AMBULATORY_CARE_PROVIDER_SITE_OTHER): Payer: Self-pay

## 2018-10-03 ENCOUNTER — Encounter (INDEPENDENT_AMBULATORY_CARE_PROVIDER_SITE_OTHER): Payer: Self-pay

## 2018-10-05 ENCOUNTER — Encounter (INDEPENDENT_AMBULATORY_CARE_PROVIDER_SITE_OTHER): Payer: Self-pay

## 2018-10-05 ENCOUNTER — Telehealth: Payer: Self-pay | Admitting: Family Medicine

## 2018-10-05 NOTE — Telephone Encounter (Signed)
LM asking pt to call back to scheduled an 4-6 wk f/up for mood towards the end of September.

## 2018-10-06 ENCOUNTER — Encounter (INDEPENDENT_AMBULATORY_CARE_PROVIDER_SITE_OTHER): Payer: Self-pay

## 2018-10-06 DIAGNOSIS — F341 Dysthymic disorder: Secondary | ICD-10-CM | POA: Diagnosis not present

## 2018-10-13 DIAGNOSIS — F341 Dysthymic disorder: Secondary | ICD-10-CM | POA: Diagnosis not present

## 2018-10-27 DIAGNOSIS — F341 Dysthymic disorder: Secondary | ICD-10-CM | POA: Diagnosis not present

## 2018-11-03 DIAGNOSIS — F341 Dysthymic disorder: Secondary | ICD-10-CM | POA: Diagnosis not present

## 2018-11-10 ENCOUNTER — Encounter: Payer: Self-pay | Admitting: Family Medicine

## 2018-11-10 ENCOUNTER — Other Ambulatory Visit: Payer: Self-pay

## 2018-11-10 ENCOUNTER — Ambulatory Visit (INDEPENDENT_AMBULATORY_CARE_PROVIDER_SITE_OTHER): Payer: BC Managed Care – PPO | Admitting: Family Medicine

## 2018-11-10 VITALS — Temp 97.8°F | Ht 66.75 in | Wt 164.5 lb

## 2018-11-10 DIAGNOSIS — F419 Anxiety disorder, unspecified: Secondary | ICD-10-CM | POA: Diagnosis not present

## 2018-11-10 DIAGNOSIS — F329 Major depressive disorder, single episode, unspecified: Secondary | ICD-10-CM | POA: Diagnosis not present

## 2018-11-10 DIAGNOSIS — F341 Dysthymic disorder: Secondary | ICD-10-CM | POA: Diagnosis not present

## 2018-11-10 NOTE — Progress Notes (Signed)
I have discussed the procedure for the virtual visit with the patient who has given consent to proceed with assessment and treatment.   Jessica L Brodmerkel, CMA     

## 2018-11-10 NOTE — Progress Notes (Signed)
   Virtual Visit via Video   I connected with patient on 11/10/18 at  4:00 PM EDT by a video enabled telemedicine application and verified that I am speaking with the correct person using two identifiers.  Location patient: Home Location provider: Acupuncturist, Office Persons participating in the virtual visit: Patient, Provider, [place] (Jess B)  I discussed the limitations of evaluation and management by telemedicine and the availability of in person appointments. The patient expressed understanding and agreed to proceed.  Subjective:   HPI:   Anxiety/depression- at last visit we added Wellbutrin 150mg  daily to her current 100mg  Zoloft.  Pt didn't notice much of a change for the first 3 weeks but described a significant change week 4.  Irritability is much improved.  No attributable weight gain.  Still having sleep issues.  Unfortunately not new for pt.  ROS:   See pertinent positives and negatives per HPI.  Patient Active Problem List   Diagnosis Date Noted  . Anxiety and depression 08/16/2018  . 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 Excessive daytime sleepiness 09/23/2017  . 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 Allergic rhinitis 04/09/2017  . Hx of gestational diabetes mellitus, not currently pregnant 03/11/2017  . Overweight (BMI 25.0-29.9) 03/11/2017  . Disordered sleep 03/11/2017    Social History   Tobacco Use  . Smoking status: Never Smoker  . Smokeless tobacco: Never Used  Substance Use Topics  . Alcohol use: Yes    Alcohol/week: 2.0 - 10.0 standard drinks    Types: 2 - 10 Glasses of wine per week    Current Outpatient Medications:  .  buPROPion (WELLBUTRIN XL) 150 MG 24 hr tablet, Take 1 tablet (150 mg total) by mouth daily., Disp: 30 tablet, Rfl: 3 .  doxylamine, Sleep, (UNISOM) 25 MG tablet, Take 25 mg by mouth at bedtime as needed., Disp: , Rfl:  .  sertraline (ZOLOFT) 100 MG tablet, Take 1 tablet (100 mg total) by mouth daily., Disp: 30 tablet,  Rfl: 3 .  Vitamin D, Cholecalciferol, 1000 units CAPS, Take by mouth., Disp: , Rfl:  .  b complex vitamins tablet, Take 1 tablet by mouth daily., Disp: , Rfl:   No Known Allergies  Objective:   Temp 97.8 F (36.6 C) (Oral)   Ht 5' 6.75" (1.695 m)   Wt 164 lb 8 oz (74.6 kg)   BMI 25.96 kg/m   AAOx3, NAD NCAT, EOMI No obvious CN deficits Coloring WNL Pt is able to speak clearly, coherently without shortness of breath or increased work of breathing.  Thought process is linear.  Mood is appropriate.   Assessment and Plan:   Anxiety/depression- pt reports mood is much improved since last visit.  Feels addition of Wellbutrin has been helpful.  Has not gained any more weight but is frustrated that she hasn't lost any either.  Also continues to have poor sleep patterns- but this is an ongoing issue for her.  If no improvement in sleep in 6-8 weeks we will need to revisit that.  Pt expressed understanding and is in agreement w/ plan.    Annye Asa, MD 11/10/2018

## 2018-11-24 DIAGNOSIS — F341 Dysthymic disorder: Secondary | ICD-10-CM | POA: Diagnosis not present

## 2018-12-01 DIAGNOSIS — F341 Dysthymic disorder: Secondary | ICD-10-CM | POA: Diagnosis not present

## 2018-12-08 ENCOUNTER — Other Ambulatory Visit: Payer: Self-pay

## 2018-12-08 ENCOUNTER — Encounter: Payer: Self-pay | Admitting: Family Medicine

## 2018-12-08 ENCOUNTER — Ambulatory Visit (INDEPENDENT_AMBULATORY_CARE_PROVIDER_SITE_OTHER): Payer: BC Managed Care – PPO | Admitting: Family Medicine

## 2018-12-08 VITALS — Temp 97.6°F | Ht 66.75 in | Wt 168.0 lb

## 2018-12-08 DIAGNOSIS — F341 Dysthymic disorder: Secondary | ICD-10-CM | POA: Diagnosis not present

## 2018-12-08 DIAGNOSIS — F419 Anxiety disorder, unspecified: Secondary | ICD-10-CM | POA: Diagnosis not present

## 2018-12-08 DIAGNOSIS — F428 Other obsessive-compulsive disorder: Secondary | ICD-10-CM | POA: Diagnosis not present

## 2018-12-08 DIAGNOSIS — F329 Major depressive disorder, single episode, unspecified: Secondary | ICD-10-CM | POA: Diagnosis not present

## 2018-12-08 MED ORDER — VORTIOXETINE HBR 20 MG PO TABS: 20.0000 mg | ORAL_TABLET | Freq: Every day | ORAL | 3 refills | Status: DC

## 2018-12-08 NOTE — Progress Notes (Signed)
   Virtual Visit via Video   I connected with patient on 12/08/18 at  8:30 AM EDT by a video enabled telemedicine application and verified that I am speaking with the correct person using two identifiers.  Location patient: Home Location provider: Acupuncturist, Office Persons participating in the virtual visit: Patient, Provider, [place] (Jess B)  I discussed the limitations of evaluation and management by telemedicine and the availability of in person appointments. The patient expressed understanding and agreed to proceed.  Subjective:   HPI:   Mood- pt reports that starting 2 weeks ago 'I started doing stuff that i've never done before'.  Started counting calories, weighing herself multiple times a day, 'second guessing everything I eat'.   Pt finds that she's being obsessive about food.  Pt became very upset when she attempted to put on a pair of jeans that fit last year but don't fit this year.  Pt reports she is 'far less irritable' than before but these obsessive thoughts are very bothersome.  ROS:   See pertinent positives and negatives per HPI.  Patient Active Problem List   Diagnosis Date Noted  . Anxiety and depression 08/16/2018  . 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 Excessive daytime sleepiness 09/23/2017  . 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 Allergic rhinitis 04/09/2017  . Hx of gestational diabetes mellitus, not currently pregnant 03/11/2017  . Overweight (BMI 25.0-29.9) 03/11/2017  . Disordered sleep 03/11/2017    Social History   Tobacco Use  . Smoking status: Never Smoker  . Smokeless tobacco: Never Used  Substance Use Topics  . Alcohol use: Yes    Alcohol/week: 2.0 - 10.0 standard drinks    Types: 2 - 10 Glasses of wine per week    Current Outpatient Medications:  .  b complex vitamins tablet, Take 1 tablet by mouth daily., Disp: , Rfl:  .  buPROPion (WELLBUTRIN XL) 150 MG 24 hr tablet, Take 1 tablet (150 mg total) by mouth daily., Disp:  30 tablet, Rfl: 3 .  doxylamine, Sleep, (UNISOM) 25 MG tablet, Take 25 mg by mouth at bedtime as needed., Disp: , Rfl:  .  sertraline (ZOLOFT) 100 MG tablet, Take 1 tablet (100 mg total) by mouth daily., Disp: 30 tablet, Rfl: 3 .  Vitamin D, Cholecalciferol, 1000 units CAPS, Take by mouth., Disp: , Rfl:   No Known Allergies  Objective:   Temp 97.6 F (36.4 C) (Oral)   Ht 5' 6.75" (1.695 m)   Wt 168 lb (76.2 kg)   BMI 26.51 kg/m   AAOx3, NAD NCAT, EOMI No obvious CN deficits Coloring WNL Pt is able to speak clearly, coherently without shortness of breath or increased work of breathing.  Thought process is linear.  Mood is appropriate.   Assessment and Plan:   Anxiety/Depression/obessional thoughts- new.  pt reports her irritability towards her children is much more manageable but she is now having obsessive thoughts regarding her weight, appearance, and diet.  Discussed that this could be due to the Sertraline.  Given that she failed Lexapro and Sertraline, will try a more specific SSRI in Trintellix.  Continue the Wellbutrin.  Will follow very closely as I don't want her thoughts to translate into eating disorder behaviors.   Annye Asa, MD 12/08/2018

## 2018-12-08 NOTE — Progress Notes (Signed)
I have discussed the procedure for the virtual visit with the patient who has given consent to proceed with assessment and treatment.   Jessica L Brodmerkel, CMA     

## 2018-12-09 ENCOUNTER — Ambulatory Visit: Payer: BC Managed Care – PPO

## 2018-12-13 ENCOUNTER — Other Ambulatory Visit: Payer: Self-pay | Admitting: Family Medicine

## 2018-12-15 DIAGNOSIS — F341 Dysthymic disorder: Secondary | ICD-10-CM | POA: Diagnosis not present

## 2018-12-16 ENCOUNTER — Encounter: Payer: Self-pay | Admitting: Family Medicine

## 2018-12-22 DIAGNOSIS — F341 Dysthymic disorder: Secondary | ICD-10-CM | POA: Diagnosis not present

## 2018-12-29 DIAGNOSIS — F341 Dysthymic disorder: Secondary | ICD-10-CM | POA: Diagnosis not present

## 2019-01-05 ENCOUNTER — Ambulatory Visit: Payer: BC Managed Care – PPO | Admitting: Family Medicine

## 2019-01-05 DIAGNOSIS — F341 Dysthymic disorder: Secondary | ICD-10-CM | POA: Diagnosis not present

## 2019-01-12 ENCOUNTER — Other Ambulatory Visit: Payer: Self-pay

## 2019-01-12 ENCOUNTER — Ambulatory Visit (INDEPENDENT_AMBULATORY_CARE_PROVIDER_SITE_OTHER): Payer: BC Managed Care – PPO | Admitting: Family Medicine

## 2019-01-12 ENCOUNTER — Encounter: Payer: Self-pay | Admitting: Family Medicine

## 2019-01-12 VITALS — Temp 97.6°F | Ht 66.75 in | Wt 170.0 lb

## 2019-01-12 DIAGNOSIS — F419 Anxiety disorder, unspecified: Secondary | ICD-10-CM | POA: Diagnosis not present

## 2019-01-12 DIAGNOSIS — F329 Major depressive disorder, single episode, unspecified: Secondary | ICD-10-CM | POA: Diagnosis not present

## 2019-01-12 DIAGNOSIS — F32A Depression, unspecified: Secondary | ICD-10-CM

## 2019-01-12 MED ORDER — FLUOXETINE HCL 20 MG PO TABS: 20.0000 mg | ORAL_TABLET | Freq: Every day | ORAL | 3 refills | Status: DC

## 2019-01-12 NOTE — Progress Notes (Signed)
I have discussed the procedure for the virtual visit with the patient who has given consent to proceed with assessment and treatment.   Jessica L Brodmerkel, CMA     

## 2019-01-12 NOTE — Progress Notes (Signed)
   Virtual Visit via Video   I connected with patient on 01/12/19 at  4:00 PM EST by a video enabled telemedicine application and verified that I am speaking with the correct person using two identifiers.  Location patient: Home Location provider: Acupuncturist, Office Persons participating in the virtual visit: Patient, Provider, [place] (Jess B)  I discussed the limitations of evaluation and management by telemedicine and the availability of in person appointments. The patient expressed understanding and agreed to proceed.  Subjective:   HPI:   Anxiety/depression- at last visit Sertraline was stopped due to obsessive thoughts and she was started on Trintellix.  Reports things are 'not well'.  Increased 'depressive symptoms', aggravated by weight gain, increased irritability.  Not sleeping well.  'hit a wall' a few weeks ago.  'I don't have anything left, I feel really empty'.  Reports obsessive thoughts have improved but have been replaced by 'self loathing'.  Lexapro did not 'do anything'.  Sertraline caused obsessive thoughts.  Has not been on Prozac.    ROS:   See pertinent positives and negatives per HPI.  Patient Active Problem List   Diagnosis Date Noted  . Anxiety and depression 08/16/2018  . 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 Excessive daytime sleepiness 09/23/2017  . 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 Allergic rhinitis 04/09/2017  . Hx of gestational diabetes mellitus, not currently pregnant 03/11/2017  . Overweight (BMI 25.0-29.9) 03/11/2017  . Disordered sleep 03/11/2017    Social History   Tobacco Use  . Smoking status: Never Smoker  . Smokeless tobacco: Never Used  Substance Use Topics  . Alcohol use: Yes    Alcohol/week: 2.0 - 10.0 standard drinks    Types: 2 - 10 Glasses of wine per week    Current Outpatient Medications:  .  b complex vitamins tablet, Take 1 tablet by mouth daily., Disp: , Rfl:  .  buPROPion (WELLBUTRIN XL) 150 MG 24 hr  tablet, Take 1 tablet (150 mg total) by mouth daily., Disp: 30 tablet, Rfl: 3 .  doxylamine, Sleep, (UNISOM) 25 MG tablet, Take 25 mg by mouth at bedtime as needed., Disp: , Rfl:  .  Vitamin D, Cholecalciferol, 1000 units CAPS, Take by mouth., Disp: , Rfl:  .  vortioxetine HBr (TRINTELLIX) 20 MG TABS tablet, Take 1 tablet (20 mg total) by mouth daily., Disp: 30 tablet, Rfl: 3  No Known Allergies  Objective:   Temp 97.6 F (36.4 C) (Tympanic)   Ht 5' 6.75" (1.695 m)   Wt 170 lb (77.1 kg)   BMI 26.83 kg/m  AAOx3, NAD NCAT, EOMI No obvious CN deficits Coloring WNL Pt is able to speak clearly, coherently without shortness of breath or increased work of breathing.  Thought process is linear.  Mood is appropriate.   Assessment and Plan:   Anxiety/Depression- deteriorated.  Pt's obsessive thoughts have stopped but her depression sxs 'have worsened in every way'.  Denies SI/HI.  Still seeing counselor.  Since Trintellix was not effective, will switch to Prozac and monitor closely for improvement.  Discussed increasing Wellbutrin but pt was very hesitant as she feels the addition of Wellbutrin worsened her weight gain.  Will follow closely.   Annye Asa, MD 01/12/2019

## 2019-01-19 DIAGNOSIS — F341 Dysthymic disorder: Secondary | ICD-10-CM | POA: Diagnosis not present

## 2019-01-25 ENCOUNTER — Other Ambulatory Visit: Payer: Self-pay | Admitting: Family Medicine

## 2019-01-26 DIAGNOSIS — F341 Dysthymic disorder: Secondary | ICD-10-CM | POA: Diagnosis not present

## 2019-02-02 ENCOUNTER — Other Ambulatory Visit: Payer: Self-pay

## 2019-02-02 ENCOUNTER — Encounter: Payer: Self-pay | Admitting: Family Medicine

## 2019-02-02 ENCOUNTER — Ambulatory Visit (INDEPENDENT_AMBULATORY_CARE_PROVIDER_SITE_OTHER): Payer: BC Managed Care – PPO | Admitting: Family Medicine

## 2019-02-02 VITALS — Temp 97.7°F | Ht 66.75 in | Wt 165.0 lb

## 2019-02-02 DIAGNOSIS — F329 Major depressive disorder, single episode, unspecified: Secondary | ICD-10-CM | POA: Diagnosis not present

## 2019-02-02 DIAGNOSIS — F341 Dysthymic disorder: Secondary | ICD-10-CM | POA: Diagnosis not present

## 2019-02-02 DIAGNOSIS — F32A Depression, unspecified: Secondary | ICD-10-CM

## 2019-02-02 DIAGNOSIS — F419 Anxiety disorder, unspecified: Secondary | ICD-10-CM | POA: Diagnosis not present

## 2019-02-02 NOTE — Progress Notes (Signed)
I have discussed the procedure for the virtual visit with the patient who has given consent to proceed with assessment and treatment.   Talan Gildner L Shizuo Biskup, CMA     

## 2019-02-02 NOTE — Progress Notes (Signed)
   Virtual Visit via Video   I connected with patient on 02/02/19 at  9:30 AM EST by a video enabled telemedicine application and verified that I am speaking with the correct person using two identifiers.  Location patient: Home Location provider: Acupuncturist, Office Persons participating in the virtual visit: Patient, Provider, [place] (Jess B)  I discussed the limitations of evaluation and management by telemedicine and the availability of in person appointments. The patient expressed understanding and agreed to proceed.  Subjective:   HPI:   Mood- at last visit, pt was switched to Prozac.  Pt feels Prozac is improvement over Trintellix.  Reports feeling 'a lot less depression' since switching medication.  Is still able to connect w/ emotions.  'so far I like it'.  Pt is down 5 lbs.  Obsessional thoughts are much less frequent and fleeting.    ROS:   See pertinent positives and negatives per HPI.  Patient Active Problem List   Diagnosis Date Noted  . Anxiety and depression 08/16/2018  . 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 Excessive daytime sleepiness 09/23/2017  . 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 Allergic rhinitis 04/09/2017  . Hx of gestational diabetes mellitus, not currently pregnant 03/11/2017  . Overweight (BMI 25.0-29.9) 03/11/2017  . Disordered sleep 03/11/2017    Social History   Tobacco Use  . Smoking status: Never Smoker  . Smokeless tobacco: Never Used  Substance Use Topics  . Alcohol use: Yes    Alcohol/week: 2.0 - 10.0 standard drinks    Types: 2 - 10 Glasses of wine per week    Current Outpatient Medications:  .  b complex vitamins tablet, Take 1 tablet by mouth daily., Disp: , Rfl:  .  buPROPion (WELLBUTRIN XL) 150 MG 24 hr tablet, TAKE 1 TABLET BY MOUTH EVERY DAY, Disp: 30 tablet, Rfl: 3 .  doxylamine, Sleep, (UNISOM) 25 MG tablet, Take 25 mg by mouth at bedtime as needed., Disp: , Rfl:  .  FLUoxetine (PROZAC) 20 MG tablet, Take 1  tablet (20 mg total) by mouth daily., Disp: 30 tablet, Rfl: 3 .  Vitamin D, Cholecalciferol, 1000 units CAPS, Take by mouth., Disp: , Rfl:   No Known Allergies  Objective:   Temp 97.7 F (36.5 C) (Oral)   Ht 5' 6.75" (1.695 m)   Wt 165 lb (74.8 kg)   BMI 26.04 kg/m  AAOx3, NAD NCAT, EOMI No obvious CN deficits Coloring WNL Pt is able to speak clearly, coherently without shortness of breath or increased work of breathing.  Thought process is linear.  Mood is appropriate.   Assessment and Plan:   Anxiety/depression- improved since switching to Prozac.  Pt reports mood is better, she is still able to connect w/ her emotions, and overall likes the change.  Will hold on a dose increase at this time but she knows she is able to contact me at any time.  Will follow.   Annye Asa, MD 02/02/2019

## 2019-02-16 ENCOUNTER — Ambulatory Visit: Payer: BC Managed Care – PPO | Admitting: Family Medicine

## 2019-02-16 ENCOUNTER — Ambulatory Visit (INDEPENDENT_AMBULATORY_CARE_PROVIDER_SITE_OTHER): Payer: BC Managed Care – PPO

## 2019-02-16 ENCOUNTER — Ambulatory Visit (INDEPENDENT_AMBULATORY_CARE_PROVIDER_SITE_OTHER): Payer: BC Managed Care – PPO | Admitting: Family Medicine

## 2019-02-16 ENCOUNTER — Encounter: Payer: Self-pay | Admitting: Family Medicine

## 2019-02-16 ENCOUNTER — Other Ambulatory Visit: Payer: Self-pay

## 2019-02-16 VITALS — Temp 98.6°F

## 2019-02-16 DIAGNOSIS — M545 Low back pain, unspecified: Secondary | ICD-10-CM

## 2019-02-16 LAB — POCT URINALYSIS DIPSTICK
Bilirubin, UA: NEGATIVE
Blood, UA: NEGATIVE
Glucose, UA: NEGATIVE
Ketones, UA: NEGATIVE
Leukocytes, UA: NEGATIVE
Nitrite, UA: NEGATIVE
Protein, UA: NEGATIVE
Spec Grav, UA: 1.015 (ref 1.010–1.025)
Urobilinogen, UA: 0.2 E.U./dL
pH, UA: 6 (ref 5.0–8.0)

## 2019-02-16 MED ORDER — METHOCARBAMOL 500 MG PO TABS: 500.0000 mg | ORAL_TABLET | Freq: Three times a day (TID) | ORAL | 0 refills | Status: DC | PRN

## 2019-02-16 MED ORDER — MELOXICAM 15 MG PO TABS: 15.0000 mg | ORAL_TABLET | Freq: Every day | ORAL | 1 refills | Status: DC

## 2019-02-16 NOTE — Progress Notes (Signed)
I have discussed the procedure for the virtual visit with the patient who has given consent to proceed with assessment and treatment.   Carletha Dawn L Margrette Wynia, CMA     

## 2019-02-16 NOTE — Progress Notes (Signed)
   Virtual Visit via Video   I connected with patient on 02/16/19 at  4:00 PM EST by a video enabled telemedicine application and verified that I am speaking with the correct person using two identifiers.  Location patient: Home Location provider: [facility], Office Persons participating in the virtual visit: Patient, Provider, CMA (Jess B)  I discussed the limitations of evaluation and management by telemedicine and the availability of in person appointments. The patient expressed understanding and agreed to proceed.  Subjective:   HPI:   Back pain- sxs started ~2 weeks ago.  No known injury.  Woke up and pain was present.  No radiation of pain.  Pain is unchanged.  Pt has not been able to pinpoint what makes sxs better and worse.  Pain is not related to activity level.  No dysuria.  No pain w/ lying down.  No relation to eating.  R sided.  Pain is located just above posterior iliac crest.  Pain from iliac crest to lower glute.  Ibuprofen will 'take the edge off'.  Improved w/ lidocaine/icy hot patch temporarily.    ROS:   See pertinent positives and negatives per HPI.  Patient Active Problem List   Diagnosis Date Noted  . Anxiety and depression 08/16/2018  . 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 Excessive daytime sleepiness 09/23/2017  . 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 Allergic rhinitis 04/09/2017  . Hx of gestational diabetes mellitus, not currently pregnant 03/11/2017  . Overweight (BMI 25.0-29.9) 03/11/2017  . Disordered sleep 03/11/2017    Social History   Tobacco Use  . Smoking status: Never Smoker  . Smokeless tobacco: Never Used  Substance Use Topics  . Alcohol use: Yes    Alcohol/week: 2.0 - 10.0 standard drinks    Types: 2 - 10 Glasses of wine per week    Current Outpatient Medications:  .  b complex vitamins tablet, Take 1 tablet by mouth daily., Disp: , Rfl:  .  buPROPion (WELLBUTRIN XL) 150 MG 24 hr tablet, TAKE 1 TABLET BY MOUTH EVERY  DAY, Disp: 30 tablet, Rfl: 3 .  doxylamine, Sleep, (UNISOM) 25 MG tablet, Take 25 mg by mouth at bedtime as needed., Disp: , Rfl:  .  FLUoxetine (PROZAC) 20 MG tablet, Take 1 tablet (20 mg total) by mouth daily., Disp: 30 tablet, Rfl: 3 .  Vitamin D, Cholecalciferol, 1000 units CAPS, Take by mouth., Disp: , Rfl:   No Known Allergies  Objective:   Temp 98.6 F (37 C) (Tympanic)  AAOx3, NAD NCAT, EOMI No obvious CN deficits Coloring WNL Pt is able to speak clearly, coherently without shortness of breath or increased work of breathing.  Thought process is linear.  Mood is appropriate.   Assessment and Plan:   Acute R sided low back pain- new.  sxs started ~2 weeks ago.  No known injury.  UA earlier today WNL.  Pain improved w/ lidocaine patch leading me to suspect musculoskeletal etiology.  Start daily NSAID, add muscle relaxer.  If no improvement in 7 days will need re-eval.  Pt expressed understanding and is in agreement w/ plan.    Neena Rhymes, MD 02/16/2019

## 2019-02-17 DIAGNOSIS — F341 Dysthymic disorder: Secondary | ICD-10-CM | POA: Diagnosis not present

## 2019-02-23 DIAGNOSIS — F341 Dysthymic disorder: Secondary | ICD-10-CM | POA: Diagnosis not present

## 2019-02-28 ENCOUNTER — Encounter: Payer: Self-pay | Admitting: Family Medicine

## 2019-02-28 DIAGNOSIS — M545 Low back pain, unspecified: Secondary | ICD-10-CM

## 2019-03-02 DIAGNOSIS — F341 Dysthymic disorder: Secondary | ICD-10-CM | POA: Diagnosis not present

## 2019-03-09 ENCOUNTER — Other Ambulatory Visit: Payer: Self-pay | Admitting: Family Medicine

## 2019-03-09 NOTE — Telephone Encounter (Signed)
Please advise, this was filled filled 02/16/19

## 2019-03-16 ENCOUNTER — Encounter: Payer: Self-pay | Admitting: Family Medicine

## 2019-03-17 MED ORDER — FLUOXETINE HCL 40 MG PO CAPS: 40.0000 mg | ORAL_CAPSULE | Freq: Every day | ORAL | 3 refills | Status: DC

## 2019-03-23 DIAGNOSIS — F341 Dysthymic disorder: Secondary | ICD-10-CM | POA: Diagnosis not present

## 2019-04-06 DIAGNOSIS — F341 Dysthymic disorder: Secondary | ICD-10-CM | POA: Diagnosis not present

## 2019-04-13 DIAGNOSIS — F341 Dysthymic disorder: Secondary | ICD-10-CM | POA: Diagnosis not present

## 2019-04-18 ENCOUNTER — Other Ambulatory Visit: Payer: Self-pay | Admitting: Family Medicine

## 2019-04-20 ENCOUNTER — Encounter: Payer: Self-pay | Admitting: Family Medicine

## 2019-04-20 DIAGNOSIS — F341 Dysthymic disorder: Secondary | ICD-10-CM | POA: Diagnosis not present

## 2019-04-27 DIAGNOSIS — F341 Dysthymic disorder: Secondary | ICD-10-CM | POA: Diagnosis not present

## 2019-05-04 DIAGNOSIS — F341 Dysthymic disorder: Secondary | ICD-10-CM | POA: Diagnosis not present

## 2019-05-11 DIAGNOSIS — F341 Dysthymic disorder: Secondary | ICD-10-CM | POA: Diagnosis not present

## 2019-05-18 DIAGNOSIS — F341 Dysthymic disorder: Secondary | ICD-10-CM | POA: Diagnosis not present

## 2019-05-25 DIAGNOSIS — F341 Dysthymic disorder: Secondary | ICD-10-CM | POA: Diagnosis not present

## 2019-06-03 ENCOUNTER — Encounter: Payer: Self-pay | Admitting: Family Medicine

## 2019-06-03 DIAGNOSIS — Z8481 Family history of carrier of genetic disease: Secondary | ICD-10-CM

## 2019-06-03 DIAGNOSIS — H00025 Hordeolum internum left lower eyelid: Secondary | ICD-10-CM | POA: Diagnosis not present

## 2019-06-08 DIAGNOSIS — F341 Dysthymic disorder: Secondary | ICD-10-CM | POA: Diagnosis not present

## 2019-06-15 ENCOUNTER — Telehealth: Payer: Self-pay | Admitting: Genetic Counselor

## 2019-06-15 DIAGNOSIS — F341 Dysthymic disorder: Secondary | ICD-10-CM | POA: Diagnosis not present

## 2019-06-15 NOTE — Telephone Encounter (Signed)
Received a new pt referral from Dr. Beverely Low for Ms. Sharon [name] to see a Dentist for fhx of breast cancer. Sharon [name] has been cld and scheduled to see Irving Burton for a virtual visit on 5/20 at 1pm. I verified the pt has access to mychart.

## 2019-06-25 DIAGNOSIS — Z20828 Contact with and (suspected) exposure to other viral communicable diseases: Secondary | ICD-10-CM | POA: Diagnosis not present

## 2019-06-25 DIAGNOSIS — Z03818 Encounter for observation for suspected exposure to other biological agents ruled out: Secondary | ICD-10-CM | POA: Diagnosis not present

## 2019-06-29 DIAGNOSIS — F341 Dysthymic disorder: Secondary | ICD-10-CM | POA: Diagnosis not present

## 2019-06-30 ENCOUNTER — Inpatient Hospital Stay: Payer: BC Managed Care – PPO | Attending: Genetic Counselor | Admitting: Genetic Counselor

## 2019-06-30 ENCOUNTER — Encounter: Payer: Self-pay | Admitting: Genetic Counselor

## 2019-06-30 DIAGNOSIS — Z801 Family history of malignant neoplasm of trachea, bronchus and lung: Secondary | ICD-10-CM

## 2019-06-30 DIAGNOSIS — Z8481 Family history of carrier of genetic disease: Secondary | ICD-10-CM

## 2019-06-30 DIAGNOSIS — Z800 Family history of malignant neoplasm of digestive organs: Secondary | ICD-10-CM

## 2019-06-30 DIAGNOSIS — Z803 Family history of malignant neoplasm of breast: Secondary | ICD-10-CM

## 2019-06-30 DIAGNOSIS — Z807 Family history of other malignant neoplasms of lymphoid, hematopoietic and related tissues: Secondary | ICD-10-CM

## 2019-06-30 DIAGNOSIS — Z808 Family history of malignant neoplasm of other organs or systems: Secondary | ICD-10-CM

## 2019-06-30 DIAGNOSIS — Z8042 Family history of malignant neoplasm of prostate: Secondary | ICD-10-CM

## 2019-06-30 DIAGNOSIS — Z806 Family history of leukemia: Secondary | ICD-10-CM
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
## 2019-07-05 ENCOUNTER — Encounter: Payer: Self-pay | Admitting: Family Medicine

## 2019-07-05 DIAGNOSIS — E663 Overweight: Secondary | ICD-10-CM

## 2019-07-05 DIAGNOSIS — Z789 Other specified health status: Secondary | ICD-10-CM

## 2019-07-06 DIAGNOSIS — F341 Dysthymic disorder: Secondary | ICD-10-CM | POA: Diagnosis not present

## 2019-07-07 DIAGNOSIS — H16223 Keratoconjunctivitis sicca, not specified as Sjogren's, bilateral: Secondary | ICD-10-CM | POA: Diagnosis not present

## 2019-07-13 ENCOUNTER — Encounter: Payer: Self-pay | Admitting: Family Medicine

## 2019-07-13 DIAGNOSIS — Z713 Dietary counseling and surveillance: Secondary | ICD-10-CM | POA: Diagnosis not present

## 2019-07-14 DIAGNOSIS — Z8481 Family history of carrier of genetic disease: Secondary | ICD-10-CM | POA: Diagnosis not present

## 2019-07-14 DIAGNOSIS — Z8042 Family history of malignant neoplasm of prostate: Secondary | ICD-10-CM | POA: Diagnosis not present

## 2019-07-14 DIAGNOSIS — Z803 Family history of malignant neoplasm of breast: Secondary | ICD-10-CM | POA: Diagnosis not present

## 2019-08-03 DIAGNOSIS — Z713 Dietary counseling and surveillance: Secondary | ICD-10-CM | POA: Diagnosis not present

## 2019-08-04 ENCOUNTER — Encounter: Payer: Self-pay | Admitting: Family Medicine

## 2019-08-04 ENCOUNTER — Other Ambulatory Visit: Payer: Self-pay

## 2019-08-04 ENCOUNTER — Telehealth: Payer: Self-pay | Admitting: Genetic Counselor

## 2019-08-04 ENCOUNTER — Ambulatory Visit (INDEPENDENT_AMBULATORY_CARE_PROVIDER_SITE_OTHER): Payer: BC Managed Care – PPO | Admitting: Family Medicine

## 2019-08-04 VITALS — BP 117/70 | HR 62 | Temp 97.9°F | Resp 16 | Ht 67.0 in | Wt 176.4 lb

## 2019-08-04 DIAGNOSIS — E663 Overweight: Secondary | ICD-10-CM | POA: Diagnosis not present

## 2019-08-04 DIAGNOSIS — Z0000 Encounter for general adult medical examination without abnormal findings: Secondary | ICD-10-CM | POA: Diagnosis not present

## 2019-08-04 LAB — LIPID PANEL
Cholesterol: 120 mg/dL (ref 0–200)
HDL: 43.8 mg/dL (ref >39.00)
LDL Cholesterol: 65 mg/dL (ref 0–99)
NonHDL: 76.4
Total CHOL/HDL Ratio: 3
Triglycerides: 55 mg/dL (ref 0.0–149.0)
VLDL: 11 mg/dL (ref 0.0–40.0)

## 2019-08-04 LAB — HEPATIC FUNCTION PANEL
ALT: 11 U/L (ref 0–35)
AST: 14 U/L (ref 0–37)
Albumin: 4.4 g/dL (ref 3.5–5.2)
Alkaline Phosphatase: 44 U/L (ref 39–117)
Bilirubin, Direct: 0.2 mg/dL (ref 0.0–0.3)
Total Bilirubin: 0.8 mg/dL (ref 0.2–1.2)
Total Protein: 6.6 g/dL (ref 6.0–8.3)

## 2019-08-04 LAB — CBC WITH DIFFERENTIAL/PLATELET
Basophils Absolute: 0 10*3/uL (ref 0.0–0.1)
Basophils Relative: 1 % (ref 0.0–3.0)
Eosinophils Absolute: 0.1 10*3/uL (ref 0.0–0.7)
Eosinophils Relative: 2.7 % (ref 0.0–5.0)
HCT: 38.1 % (ref 36.0–46.0)
Hemoglobin: 12.8 g/dL (ref 12.0–15.0)
Lymphocytes Relative: 23.4 % (ref 12.0–46.0)
Lymphs Abs: 1 10*3/uL (ref 0.7–4.0)
MCHC: 33.7 g/dL (ref 30.0–36.0)
MCV: 91.3 fl (ref 78.0–100.0)
Monocytes Absolute: 0.5 10*3/uL (ref 0.1–1.0)
Monocytes Relative: 10.4 % (ref 3.0–12.0)
Neutro Abs: 2.8 10*3/uL (ref 1.4–7.7)
Neutrophils Relative %: 62.5 % (ref 43.0–77.0)
Platelets: 337 10*3/uL (ref 150.0–400.0)
RBC: 4.17 Mil/uL (ref 3.87–5.11)
RDW: 13.2 % (ref 11.5–15.5)
WBC: 4.5 10*3/uL (ref 4.0–10.5)

## 2019-08-04 LAB — VITAMIN D 25 HYDROXY (VIT D DEFICIENCY, FRACTURES): VITD: 28.59 ng/mL — ABNORMAL LOW (ref 30.00–100.00)

## 2019-08-04 LAB — T4, FREE: Free T4: 0.82 ng/dL (ref 0.60–1.60)

## 2019-08-04 LAB — T3, FREE: T3, Free: 3.3 pg/mL (ref 2.3–4.2)

## 2019-08-04 LAB — TSH: TSH: 1.73 u[IU]/mL (ref 0.35–4.50)

## 2019-08-04 NOTE — Assessment & Plan Note (Signed)
Pt has gained 11 lbs since last visit.  This has her on the verge of 'panic'.  She has met w/ nutritionist x2 and is exercising regularly.  She reports consuming 1800-2500 calories/day.  Check labs to assess for possible metabolic abnormality (thyroid) and risk stratify.  Will follow.

## 2019-08-04 NOTE — Patient Instructions (Signed)
Follow up in 3 months to recheck weight loss progress We'll notify you of your lab results and make any changes Keep up the good work on healthy diet and regular exercise- you're doing great! Call with any questions or concerns Have a great summer!!!

## 2019-08-04 NOTE — Assessment & Plan Note (Signed)
Pt's PE WNL w/ exception of being overweight.  Has pap scheduled.  Check labs.  Anticipatory guidance provided.

## 2019-08-04 NOTE — Telephone Encounter (Signed)
Sharon [name] called for advice regarding her saliva sample for genetic testing - she completed the saliva kit about two weeks ago but it has been sitting in her car since then. We will check with the laboratory to see if she will need to provide another sample. If so, we will let her know and coordinate sample collection at that time.

## 2019-08-04 NOTE — Progress Notes (Signed)
   Subjective:    Patient ID: Sharon [name], female    DOB: [birth year], 39 y.o.   MRN: [id number]  HPI CPE- has pap scheduled for next month.  UTD on immunizations.  Pt has gained 11 lbs since last visit.  Pt is running 3-4 miles 3-5x/week w/ multiple walks.  Has been to a nutritionist twice and pt reports there were minimal suggestions to improve her diet.   Review of Systems Patient reports no vision/ hearing changes, adenopathy,fever, persistant/recurrent hoarseness , swallowing issues, chest pain, palpitations, edema, persistant/recurrent cough, hemoptysis, dyspnea (rest/exertional/paroxysmal nocturnal), gastrointestinal bleeding (melena, rectal bleeding), abdominal pain, significant heartburn, bowel changes, GU symptoms (dysuria, hematuria, incontinence), Gyn symptoms (abnormal  bleeding, pain),  syncope, focal weakness, memory loss, numbness & tingling, skin/hair/nail changes, abnormal bruising or bleeding, anxiety, or depression.   This visit occurred during the SARS-CoV-2 public health emergency.  Safety protocols were in place, including screening questions prior to the visit, additional usage of staff PPE, and extensive cleaning of exam room while observing appropriate contact time as indicated for disinfecting solutions.       Objective:   Physical Exam General Appearance:    Alert, cooperative, no distress, appears stated age  Head:    Normocephalic, without obvious abnormality, atraumatic  Eyes:    PERRL, conjunctiva/corneas clear, EOM's intact, fundi    benign, both eyes  Ears:    Normal TM's and external ear canals, both ears  Nose:   Deferred due to COVID  Throat:   Neck:   Supple, symmetrical, trachea midline, no adenopathy;    Thyroid: no enlargement/tenderness/nodules  Back:     Symmetric, no curvature, ROM normal, no CVA tenderness  Lungs:     Clear to auscultation bilaterally, respirations unlabored  Chest Wall:    No tenderness or deformity   Heart:    Regular rate  and rhythm, S1 and S2 normal, no murmur, rub   or gallop  Breast Exam:    Deferred to GYN  Abdomen:     Soft, non-tender, bowel sounds active all four quadrants,    no masses, no organomegaly  Genitalia:    Deferred to GYN  Rectal:    Extremities:   Extremities normal, atraumatic, no cyanosis or edema  Pulses:   2+ and symmetric all extremities  Skin:   Skin color, texture, turgor normal, no rashes or lesions  Lymph nodes:   Cervical, supraclavicular, and axillary nodes normal  Neurologic:   CNII-XII intact, normal strength, sensation and reflexes    throughout          Assessment & Plan:

## 2019-08-05 ENCOUNTER — Other Ambulatory Visit: Payer: Self-pay | Admitting: General Practice

## 2019-08-05 ENCOUNTER — Encounter: Payer: Self-pay | Admitting: Family Medicine

## 2019-08-05 LAB — BASIC METABOLIC PANEL
BUN: 9 mg/dL (ref 6–23)
CO2: 29 mEq/L (ref 19–32)
Calcium: 9.5 mg/dL (ref 8.4–10.5)
Chloride: 106 mEq/L (ref 96–112)
Creatinine, Ser: 0.64 mg/dL (ref 0.40–1.20)
GFR: 104.55 mL/min (ref >60.00)
Glucose, Bld: 100 mg/dL — ABNORMAL HIGH (ref 70–99)
Potassium: 4.2 mEq/L (ref 3.5–5.1)
Sodium: 138 mEq/L (ref 135–145)

## 2019-08-05 MED ORDER — VITAMIN D (ERGOCALCIFEROL) 1.25 MG (50000 UNIT) PO CAPS
50000.0000 [IU] | ORAL_CAPSULE | ORAL | 0 refills | Status: DC
Start: 2019-08-05 — End: 2021-09-18

## 2019-08-11 DIAGNOSIS — Z6826 Body mass index (BMI) 26.0-26.9, adult: Secondary | ICD-10-CM | POA: Diagnosis not present

## 2019-08-11 DIAGNOSIS — Z01419 Encounter for gynecological examination (general) (routine) without abnormal findings: Secondary | ICD-10-CM | POA: Diagnosis not present

## 2019-08-17 DIAGNOSIS — F341 Dysthymic disorder: Secondary | ICD-10-CM | POA: Diagnosis not present

## 2019-08-24 DIAGNOSIS — R635 Abnormal weight gain: Secondary | ICD-10-CM | POA: Diagnosis not present

## 2019-08-24 DIAGNOSIS — Z1231 Encounter for screening mammogram for malignant neoplasm of breast: Secondary | ICD-10-CM | POA: Diagnosis not present

## 2019-08-25 ENCOUNTER — Telehealth: Payer: Self-pay | Admitting: Medical

## 2019-08-25 ENCOUNTER — Other Ambulatory Visit: Payer: Self-pay | Admitting: Family Medicine

## 2019-08-25 NOTE — Telephone Encounter (Signed)
Please advise if refill is ok.

## 2019-08-25 NOTE — Telephone Encounter (Signed)
error 

## 2019-08-25 NOTE — Telephone Encounter (Signed)
This was just filled 2 weeks ago and written for #12.  But it appears the prescription was somehow changed to #4 w/ refills.  Please call pharmacy to clarify b/c either way, she should have meds available

## 2019-08-26 NOTE — Telephone Encounter (Signed)
Called pt and advised that she should have refills at the pharmacy.

## 2019-08-31 DIAGNOSIS — Z713 Dietary counseling and surveillance: Secondary | ICD-10-CM | POA: Diagnosis not present

## 2019-08-31 DIAGNOSIS — F341 Dysthymic disorder: Secondary | ICD-10-CM | POA: Diagnosis not present

## 2019-09-01 ENCOUNTER — Telehealth: Payer: Self-pay | Admitting: Genetic Counselor

## 2019-09-01 ENCOUNTER — Ambulatory Visit: Payer: Self-pay | Admitting: Genetic Counselor

## 2019-09-01 ENCOUNTER — Encounter: Payer: Self-pay | Admitting: Genetic Counselor

## 2019-09-01 DIAGNOSIS — Z1379 Encounter for other screening for genetic and chromosomal anomalies: Secondary | ICD-10-CM

## 2019-09-01 NOTE — Telephone Encounter (Signed)
LVM that her genetic test results are available and requested that she call back to discuss them.  
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
## 2019-09-01 NOTE — Telephone Encounter (Signed)
Revealed negative genetic testing. She did not inherit the CHEK2 mutation that is in her family. Discussed that, despite this true negative result, her risk for breast cancer is likely still increased based the family history of breast cancer. Additionally, it is possible that there could also be a mutation in a different gene that we are not testing, or our current technology may not be able detect certain mutations. It will therefore be important for her to stay in contact with genetics to keep up with whether additional testing may be appropriate in the future.   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 Her result is still considered normal at this time and should not impact her medical management.

## 2019-09-08 ENCOUNTER — Ambulatory Visit (INDEPENDENT_AMBULATORY_CARE_PROVIDER_SITE_OTHER): Payer: BC Managed Care – PPO | Admitting: Addiction (Substance Use Disorder)

## 2019-09-08 ENCOUNTER — Encounter: Payer: Self-pay | Admitting: Addiction (Substance Use Disorder)

## 2019-09-08 ENCOUNTER — Other Ambulatory Visit: Payer: Self-pay

## 2019-09-08 DIAGNOSIS — F4323 Adjustment disorder with mixed anxiety and depressed mood: Secondary | ICD-10-CM | POA: Diagnosis not present
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
## 2019-09-17 ENCOUNTER — Other Ambulatory Visit: Payer: Self-pay | Admitting: Family Medicine

## 2019-09-21 ENCOUNTER — Encounter: Payer: Self-pay | Admitting: Addiction (Substance Use Disorder)

## 2019-09-21 ENCOUNTER — Ambulatory Visit (INDEPENDENT_AMBULATORY_CARE_PROVIDER_SITE_OTHER): Payer: BC Managed Care – PPO | Admitting: Addiction (Substance Use Disorder)

## 2019-09-21 DIAGNOSIS — F4323 Adjustment disorder with mixed anxiety and depressed mood: Secondary | ICD-10-CM | POA: Diagnosis not present

## 2019-09-21 NOTE — Progress Notes (Signed)
[facility] Counselor/Therapist Progress Note  Patient ID: Sharon [name], MRN: [id number],    Date: 09/21/2019  Time Spent:  Treatment Type: Individual Therapy  Reported Symptoms: angst, anger, tightness in the chest, powerlessness.  Mental Status Exam:  Appearance:   Casual     Behavior:  Sharing  Motor:  Normal  Speech/Language:   Clear and Coherent and Pressured  Affect:  Appropriate, Congruent and Full Range  Mood:  angry and anxious  Thought process:  circumstantial and flight of ideas  Thought content:    Rumination  Sensory/Perceptual disturbances:    WNL  Orientation:  x4  Attention:  Fair  Concentration:  Good  Memory:  WNL  Fund of knowledge:   Good  Insight:    Good  Judgment:   Good  Impulse Control:  Good   Risk Assessment: Danger to Self:  No Self-injurious Behavior: No Danger to Others: No Duty to Warn:no Physical Aggression / Violence:No  Access to Firearms a concern: No  Gang Involvement:No   Virtual Visit via VIDEO:  I connected with client by MyChart video enabled telemedicine/telehealth application, with their informed consent, and verified client privacy and that I am speaking with the correct person using two identifiers. I discussed the limitations, risks, security and privacy concerns of performing psychotherapy and management service virtually and confirmed their location. I also discussed with the patient that there may be a patient responsible charge related to this service and to confirm with the front desk if their insurance covers teletherapy. I also discussed with the patient the availability of in person appointments. The patient expressed understanding and agreed to proceed. I discussed the treatment planning with the client. The client was provided an opportunity to ask questions and all were answered. The client agreed with the plan and demonstrated an understanding of the instructions. The client was advised to call our  office if symptoms worsen or feel they are in a crisis state and need immediate contact. Client also reminded of a crisis line number and to use 9-1-1 if there's an emergency.  Therapist Location: office; Client Location: home.  Subjective: Client reported struggling with angst, anger, tightness in the chest, & powerlessness. Therapist used MI and mindfulness with client to support her and help her explore the core beliefs that plague her and make her feel frustrated and panicked. Client made progress identifying the thought: " I Cant trust the other adults to do what I do. There was nothing I could do to find my daughter." Client's SUDs 8/10 were weight/nausea in her gut coming from feeling powerless. Client used BSP gazespotting and made progress, being able to recognize things she doesn't have control over, and finding a settled breath in her gut. Client's goal to be working on not letting her clients invade her mind even when they're not asking her to (emailing/calling her).   Interventions: Cognitive Behavioral Therapy, Mindfulness Meditation and Motivational Interviewing & BSP.  Diagnosis:   ICD-10-CM   1. Adjustment disorder with mixed anxiety and depressed mood  F43.23    Plan of Care:  Client to return for weekly therapy with Zoila Shutter, therapist, to review again in 6 months.  Client to engage in positive self talk and challenging negative internal ruminations and self talk causing client to be overly anxious and worried using CBT, on daily practice. Client to engage in mindfulness: ie body scans each eveneing to help process and discharge emotional distress & recognize emotions. Client to utilize  BSP (brainspotting) with therapist to help client regulate their anxiety in a somatic- felt body sense way: (ie by working to reduce muscle tension, ruminations, increased heart rate, constant worrying and feeling "hyper" ) by decreasing anxiety by 33% in the next 6 months.  Client to understand  how to succeed in fighting back worry and embrace life's uncertainty. Client to prioritize sleep 8+ hours each week night AEB going to bed by 10pm each night.  Client participated in the treatment planning of their therapy.  Client agreed with the plan if there is a crisis: contact after hours office line, call 9-1-1 and/or crisis line given by therapist.  [name], LCSW, LCAS, CCTP, CCS-I, BSP

## 2019-10-05 ENCOUNTER — Other Ambulatory Visit: Payer: Self-pay

## 2019-10-05 ENCOUNTER — Encounter: Payer: Self-pay | Admitting: Addiction (Substance Use Disorder)

## 2019-10-05 ENCOUNTER — Ambulatory Visit (INDEPENDENT_AMBULATORY_CARE_PROVIDER_SITE_OTHER): Payer: BC Managed Care – PPO | Admitting: Addiction (Substance Use Disorder)

## 2019-10-05 DIAGNOSIS — Z713 Dietary counseling and surveillance: Secondary | ICD-10-CM | POA: Diagnosis not present

## 2019-10-05 DIAGNOSIS — F4323 Adjustment disorder with mixed anxiety and depressed mood: Secondary | ICD-10-CM | POA: Diagnosis not present
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
## 2019-10-12 ENCOUNTER — Telehealth: Payer: Self-pay | Admitting: Hematology and Oncology

## 2019-10-12 ENCOUNTER — Encounter: Payer: Self-pay | Admitting: Genetic Counselor

## 2019-10-12 ENCOUNTER — Telehealth: Payer: Self-pay | Admitting: Genetic Counselor

## 2019-10-12 NOTE — Telephone Encounter (Signed)
Ms. Sharon [name] called to update [name] that her oldest sister, Sharon [name], was diagnosed with breast cancer today at the age of 6. This sister tested negative for the familial CHEK2 mutation, but did not have testing for other breast cancer genes. We recommended that this sister have additional genetic testing.   We also discussed that her sister's new diagnosis of breast cancer does impact her own risk for breast cancer, but does not change our recommendations for her care at this time. Her new Tyrer-Cuzick result estimates a 49.0% lifetimes risk for breast cancer. We still recommend that Sharon [name] have high risk breast cancer screening with annual breast MRIs and annual mammograms, as well as consideration of chemoprevention such as tamoxifen. Sharon [name] has not received a phone call to schedule an appointment in the high risk breast cancer clinic. We will reach out to Isaiah Blakes to have her scheduled.

## 2019-10-12 NOTE — Telephone Encounter (Signed)
Sharon [name] has been cld and schedule to be seen in the high risk breast clinic on 10/20 at 1030am to see Dr. Pamelia Hoit. Pt aware to arrive 30 minutes early.

## 2019-10-13 ENCOUNTER — Ambulatory Visit: Payer: BC Managed Care – PPO | Admitting: Addiction (Substance Use Disorder)

## 2019-10-19 ENCOUNTER — Ambulatory Visit: Payer: BC Managed Care – PPO | Admitting: Addiction (Substance Use Disorder)

## 2019-10-26 ENCOUNTER — Other Ambulatory Visit: Payer: Self-pay

## 2019-10-26 ENCOUNTER — Ambulatory Visit (INDEPENDENT_AMBULATORY_CARE_PROVIDER_SITE_OTHER): Payer: BC Managed Care – PPO | Admitting: Addiction (Substance Use Disorder)

## 2019-10-26 ENCOUNTER — Encounter: Payer: Self-pay | Admitting: Addiction (Substance Use Disorder)

## 2019-10-26 DIAGNOSIS — F4323 Adjustment disorder with mixed anxiety and depressed mood: Secondary | ICD-10-CM | POA: Diagnosis not present

## 2019-10-26 NOTE — Progress Notes (Signed)
      [facility] Counselor/Therapist Progress Note  Patient ID: Sharon [name], MRN: [id number],    Date: 10/26/2019  Time Spent:  Treatment Type: Individual Therapy  Reported Symptoms: heavy, stressed, sad.  Mental Status Exam:  Appearance:   Casual and Well Groomed     Behavior:  Appropriate and Sharing  Motor:  Normal  Speech/Language:   Clear and Coherent and Normal Rate  Affect:  Appropriate, Congruent and Full Range  Mood:  anxious, sad and stressed  Thought process:  circumstantial and goal directed  Thought content:    Rumination  Sensory/Perceptual disturbances:    WNL  Orientation:  x4  Attention:  Fair  Concentration:  Good  Memory:  WNL  Fund of knowledge:   Good  Insight:    Good  Judgment:   Good  Impulse Control:  Good   Risk Assessment: Danger to Self:  No Self-injurious Behavior: No Danger to Others: No Duty to Warn:no Physical Aggression / Violence:No  Access to Firearms a concern: No  Gang Involvement:No   Subjective: Client reported feeling heavy, stressed, sad and nervous after getting news this week about her risk of breast cancer that provokes a need for her to get a double mastectomy. Therapist used MI & grief therapy with client to support her, validate her fears/grief, and process her fears in session. Client processed the family history of breast cancer in contrast to the lack of empathy from her family for her as she has to make the decision to have the breast surgery. Therapist used narrative & humanistic therapies to encourage client to process her story and provide unconditional positive regard. Therapist assessed for stability, and client denied SI/HI/AVH and reported hope.   Interventions: Motivational Interviewing, Humanistic/Existential and Narrative   Diagnosis:   ICD-10-CM   1. Adjustment disorder with mixed anxiety and depressed mood  F43.23      Plan of Care:  Client to return for weekly therapy with Zoila Shutter,  therapist, to review again in 6 months.  Client to engage in positive self talk and challenging negative internal ruminations and self talk causing client to be overly anxious and worried using CBT, on daily practice. Client to engage in mindfulness: ie body scans each eveneing to help process and discharge emotional distress & recognize emotions. Client to utilize BSP (brainspotting) with therapist to help client regulate their anxiety in a somatic- felt body sense way: (ie by working to reduce muscle tension, ruminations, increased heart rate, constant worrying and feeling "hyper" ) by decreasing anxiety by 33% in the next 6 months.  Client to understand how to succeed in fighting back worry and embrace life's uncertainty. Client to prioritize sleep 8+ hours each week night AEB going to bed by 10pm each night.  Client participated in the treatment planning of their therapy.  Client agreed with the plan if there is a crisis: contact after hours office line, call 9-1-1 and/or crisis line given by therapist.  [name], LCSW, LCAS, CCTP, CCS-I, BSP

## 2019-11-02 ENCOUNTER — Ambulatory Visit (INDEPENDENT_AMBULATORY_CARE_PROVIDER_SITE_OTHER): Payer: BC Managed Care – PPO | Admitting: Addiction (Substance Use Disorder)

## 2019-11-02 ENCOUNTER — Encounter: Payer: Self-pay | Admitting: Addiction (Substance Use Disorder)

## 2019-11-02 ENCOUNTER — Other Ambulatory Visit: Payer: Self-pay

## 2019-11-02 DIAGNOSIS — F4323 Adjustment disorder with mixed anxiety and depressed mood: Secondary | ICD-10-CM
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
## 2019-11-09 ENCOUNTER — Encounter: Payer: Self-pay | Admitting: Addiction (Substance Use Disorder)

## 2019-11-09 ENCOUNTER — Ambulatory Visit (INDEPENDENT_AMBULATORY_CARE_PROVIDER_SITE_OTHER): Payer: BC Managed Care – PPO | Admitting: Addiction (Substance Use Disorder)

## 2019-11-09 ENCOUNTER — Other Ambulatory Visit: Payer: Self-pay

## 2019-11-09 DIAGNOSIS — Z713 Dietary counseling and surveillance: Secondary | ICD-10-CM | POA: Diagnosis not present

## 2019-11-09 DIAGNOSIS — F411 Generalized anxiety disorder: Secondary | ICD-10-CM

## 2019-11-09 NOTE — Progress Notes (Signed)
      [facility] Counselor/Therapist Progress Note  Patient ID: Sharon [name], MRN: [id number],    Date: 11/09/2019  Time Spent:    Treatment Type: Individual Therapy  Reported Symptoms:   Mental Status Exam:  Appearance:   Casual     Behavior:  Appropriate and Sharing  Motor:  Normal  Speech/Language:   Clear and Coherent and Normal Rate  Affect:  Congruent, Labile, Full Range and Tearful  Mood:  anxious, irritable, labile and sad  Thought process:  circumstantial and goal directed  Thought content:    Obsessions and Rumination  Sensory/Perceptual disturbances:    WNL  Orientation:  x4  Attention:  Fair  Concentration:  [name]  Memory:  WNL  Fund of knowledge:   [name]  Insight:    [name]  Judgment:   [name]  Impulse Control:  [name]   Risk Assessment: Danger to Self:  No Self-injurious Behavior: No Danger to Others: No Duty to Warn:no Physical Aggression / Violence:No  Access to Firearms a concern: No  Gang Involvement:No   Subjective: Client reported some relief about the client who was asking too much of her. Therapist used MI & CBT to support and validate client's distress and progress and to help client challenge any leftover distorted thoughts. Client making progress with boundaries with not having to be everything for her clients. Client described more of her thoughts of being "unseen" by her family and others around her and therapist used MI, Mindfulness, and BSP with client to support her, validate her frustration/grief, and to help ground her emotionally and to help her find ways to feel more present and seen. Therapist assessed for stability, and client denied SI/HI/AVH and reported hope.   Interventions: Cognitive Behavioral Therapy, Mindfulness Meditation, Motivational Interviewing and Brainspotting   Diagnosis:   ICD-10-CM   1. Generalized anxiety disorder  F41.1     Plan of Care:  Client to return for weekly therapy with Sharon [name], therapist, to review  again in 6 months.  Client to engage in positive self talk and challenging negative internal ruminations and self talk causing client to be overly anxious and worried using CBT, on daily practice. Client to engage in mindfulness: ie body scans each eveneing to help process and discharge emotional distress & recognize emotions. Client to utilize BSP (brainspotting) with therapist to help client regulate their anxiety in a somatic- felt body sense way: (ie by working to reduce muscle tension, ruminations, increased heart rate, constant worrying and feeling "hyper" ) by decreasing anxiety by 33% in the next 6 months.  Client to understand how to succeed in fighting back worry and embrace life's uncertainty. Client to prioritize sleep 8+ hours each week night AEB going to bed by 10pm each night.  Client participated in the treatment planning of their therapy.  Client agreed with the plan if there is a crisis: contact after hours office line, call 9-1-1 and/or crisis line given by therapist.  Sharon Good, LCSW, LCAS, CCTP, CCS-I, BSP

## 2019-11-23 ENCOUNTER — Encounter: Payer: Self-pay | Admitting: Addiction (Substance Use Disorder)

## 2019-11-23 ENCOUNTER — Other Ambulatory Visit: Payer: Self-pay

## 2019-11-23 ENCOUNTER — Ambulatory Visit (INDEPENDENT_AMBULATORY_CARE_PROVIDER_SITE_OTHER): Payer: BC Managed Care – PPO | Admitting: Addiction (Substance Use Disorder)

## 2019-11-23 DIAGNOSIS — F411 Generalized anxiety disorder: Secondary | ICD-10-CM

## 2019-11-23 NOTE — Progress Notes (Signed)
      [facility] Counselor/Therapist Progress Note  Patient ID: Sharon [name], MRN: [id number],    Date: 11/23/2019  Time Spent:   Treatment Type: Individual Therapy  Reported Symptoms: overwhelmed.   Mental Status Exam:  Appearance:   Casual     Behavior:  Appropriate and Sharing  Motor:  Normal  Speech/Language:   Clear and Coherent and Normal Rate  Affect:  Congruent, Labile, Full Range and Tearful  Mood:  anxious and sad  Thought process:  flight of ideas  Thought content:    Obsessions and Rumination  Sensory/Perceptual disturbances:    WNL  Orientation:  x4  Attention:  Fair  Concentration:  Good  Memory:  WNL  Fund of knowledge:   Good  Insight:    Good  Judgment:   Good  Impulse Control:  Good   Risk Assessment: Danger to Self:  No Self-injurious Behavior: No Danger to Others: No Duty to Warn:no Physical Aggression / Violence:No  Access to Firearms a concern: No  Gang Involvement:No   Subjective: Client reported feeling overwhelmed after going to visit her mom and sister. Client described her borderline sister's hurtful words and dysregulation that overwhelms her (SUDs 9/10). Therapist used MI & BSP with client to process the feeling of being overwhelmed with the task of reassuring her sister that she is loved and of trying to keep her sister safe from suicide. Client made progress and her SUDs were reduced to 2/10 and given some relief about her need to rescue her sister. Client realized that only God can provide unconditional love to her sister & it took responsibility off her shoulders. Therapist assessed for stability, and client denied SI/HI/AVH.  Interventions: Motivational Interviewing and Brainspotting   Diagnosis:   ICD-10-CM   1. Generalized anxiety disorder  F41.1      Plan of Care:  Client to return for weekly therapy with Zoila Shutter, therapist, to review again in 6 months.  Client to engage in positive self talk and challenging  negative internal ruminations and self talk causing client to be overly anxious and worried using CBT, on daily practice. Client to engage in mindfulness: ie body scans each eveneing to help process and discharge emotional distress & recognize emotions. Client to utilize BSP (brainspotting) with therapist to help client regulate their anxiety in a somatic- felt body sense way: (ie by working to reduce muscle tension, ruminations, increased heart rate, constant worrying and feeling "hyper" ) by decreasing anxiety by 33% in the next 6 months.  Client to understand how to succeed in fighting back worry and embrace life's uncertainty. Client to prioritize sleep 8+ hours each week night AEB going to bed by 10pm each night.  Client participated in the treatment planning of their therapy.  Client agreed with the plan if there is a crisis: contact after hours office line, call 9-1-1 and/or crisis line given by therapist.  [name], LCSW, LCAS, CCTP, CCS-I, BSP
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
## 2019-11-30 ENCOUNTER — Inpatient Hospital Stay: Payer: BC Managed Care – PPO | Attending: Hematology and Oncology | Admitting: Hematology and Oncology

## 2019-11-30 ENCOUNTER — Other Ambulatory Visit: Payer: Self-pay

## 2019-11-30 DIAGNOSIS — Z818 Family history of other mental and behavioral disorders: Secondary | ICD-10-CM | POA: Insufficient documentation

## 2019-11-30 DIAGNOSIS — Z1501 Genetic susceptibility to malignant neoplasm of breast: Secondary | ICD-10-CM | POA: Insufficient documentation

## 2019-11-30 DIAGNOSIS — Z8042 Family history of malignant neoplasm of prostate: Secondary | ICD-10-CM | POA: Insufficient documentation

## 2019-11-30 DIAGNOSIS — Z808 Family history of malignant neoplasm of other organs or systems: Secondary | ICD-10-CM | POA: Insufficient documentation

## 2019-11-30 DIAGNOSIS — Z7289 Other problems related to lifestyle: Secondary | ICD-10-CM | POA: Insufficient documentation

## 2019-11-30 DIAGNOSIS — Z8249 Family history of ischemic heart disease and other diseases of the circulatory system: Secondary | ICD-10-CM | POA: Diagnosis not present

## 2019-11-30 DIAGNOSIS — Z803 Family history of malignant neoplasm of breast: Secondary | ICD-10-CM | POA: Insufficient documentation

## 2019-11-30 DIAGNOSIS — Z801 Family history of malignant neoplasm of trachea, bronchus and lung: Secondary | ICD-10-CM | POA: Diagnosis not present

## 2019-11-30 DIAGNOSIS — Z806 Family history of leukemia: Secondary | ICD-10-CM | POA: Insufficient documentation

## 2019-11-30 DIAGNOSIS — Z9189 Other specified personal risk factors, not elsewhere classified: Secondary | ICD-10-CM | POA: Insufficient documentation

## 2019-11-30 NOTE — Assessment & Plan Note (Signed)
Family history of CHEK2 mutation Extensive family history of breast cancer: Mother, 2 sisters, father lung cancer Genetic testing negative for mutations  Tyrer Cuzick: 10-year risk: 4.5% (population risk average 1.1%) Lifetime risk: 45.6% (population risk average: 13.2%)  Risk reduction: I discussed with the patient about different measures to reduce risk including tamoxifen therapy which can reduce the risk in half as well as physical exercise and vitamin D and turmeric as well as other measures like reducing alcohol intake and monitoring her weight as potential ways to reduce her risk.  Given her extensive family history and the fact that her sisters were diagnosed at a early age, she is extremely anxious about breast cancer.  One of her sisters breast cancer was diagnosed 3 months after the mammogram and she is now undergoing chemotherapy for stage III breast cancer, therefore she is worried that imaging studies will not be able to identify her cancer early.  Recommendation: Patient prefers to undergo bilateral mastectomies with reconstruction. If she does that there is no indication for tamoxifen or breast MRI surveillance. I spoke with Dr. Brantley Stage who is willing to see her for follow-up. She can be seen on an as-needed basis if she undergoes bilateral mastectomies.

## 2019-12-01 ENCOUNTER — Ambulatory Visit (INDEPENDENT_AMBULATORY_CARE_PROVIDER_SITE_OTHER): Payer: BC Managed Care – PPO | Admitting: Addiction (Substance Use Disorder)

## 2019-12-01 ENCOUNTER — Encounter: Payer: Self-pay | Admitting: Addiction (Substance Use Disorder)

## 2019-12-01 ENCOUNTER — Telehealth: Payer: Self-pay | Admitting: Hematology and Oncology

## 2019-12-01 DIAGNOSIS — F411 Generalized anxiety disorder: Secondary | ICD-10-CM

## 2019-12-01 NOTE — Progress Notes (Signed)
°      [facility] Counselor/Therapist Progress Note  Patient ID: Sharon [name], MRN: [id number],    Date: 12/01/2019  Time Spent:   Treatment Type: Individual Therapy  Reported Symptoms: disrupted   Mental Status Exam:  Appearance:   Casual     Behavior:  Appropriate and Sharing  Motor:  Normal  Speech/Language:   Clear and Coherent and Normal Rate  Affect:  Congruent, Labile and Tearful  Mood:  anxious  Thought process:  normal  Thought content:    Obsessions and Rumination  Sensory/Perceptual disturbances:    WNL  Orientation:  x4  Attention:  Fair  Concentration:  Good  Memory:  WNL  Fund of knowledge:   Good  Insight:    Good  Judgment:   Good  Impulse Control:  Good   Risk Assessment: Danger to Self:  No Self-injurious Behavior: No Danger to Others: No Duty to Warn:no Physical Aggression / Violence:No  Access to Firearms a concern: No  Gang Involvement:No   Subjective: Client reported feeling disrupted by conversations with her family and also by her own "cynicism". Client processed the high risk of her getting breast cancer (being 50% increased risk). Client processed the activation she feels when she hears the word "filet" that's used for taking fat from her stomach to use during reconstruction of her breasts if she gets the surgery. Therapist used MI & SFT with client to validate clients increased anxiety, support her in processing her fear related to dying from breast cancer,and to help client verbally process and consider solutions for her response to this news. Client reported making progress by maintaining her hope for reducing her risk while also learning to accept the things she cannot change. Therapist assessed for stability, and client denied SI/HI/AVH.  Interventions: Motivational Interviewing and Solution-Oriented/Positive Psychology   Diagnosis:   ICD-10-CM   1. Generalized anxiety disorder  F41.1      Plan of Care:  Client to return for  weekly therapy with Zoila Shutter, therapist, to review again in 6 months.  Client to engage in positive self talk and challenging negative internal ruminations and self talk causing client to be overly anxious and worried using CBT, on daily practice. Client to engage in mindfulness: ie body scans each eveneing to help process and discharge emotional distress & recognize emotions. Client to utilize BSP (brainspotting) with therapist to help client regulate their anxiety in a somatic- felt body sense way: (ie by working to reduce muscle tension, ruminations, increased heart rate, constant worrying and feeling "hyper" ) by decreasing anxiety by 33% in the next 6 months.  Client to understand how to succeed in fighting back worry and embrace life's uncertainty. Client to prioritize sleep 8+ hours each week night AEB going to bed by 10pm each night.  Client participated in the treatment planning of their therapy.  Client agreed with the plan if there is a crisis: contact after hours office line, call 9-1-1 and/or crisis line given by therapist.  [name], LCSW, LCAS, CCTP, CCS-I, BSP

## 2019-12-01 NOTE — Telephone Encounter (Signed)
No 10/20 los, no changes made to pt schedule  

## 2019-12-05 ENCOUNTER — Encounter: Payer: Self-pay | Admitting: *Deleted

## 2019-12-05 NOTE — Progress Notes (Signed)
Per pt request, RN successfully faxed recent office note to Dr. Billy Coast with [facility] [phone]).

## 2019-12-07 ENCOUNTER — Other Ambulatory Visit: Payer: Self-pay

## 2019-12-07 ENCOUNTER — Ambulatory Visit (INDEPENDENT_AMBULATORY_CARE_PROVIDER_SITE_OTHER): Payer: BC Managed Care – PPO | Admitting: Addiction (Substance Use Disorder)

## 2019-12-07 DIAGNOSIS — Z713 Dietary counseling and surveillance: Secondary | ICD-10-CM | POA: Diagnosis not present

## 2019-12-07 DIAGNOSIS — F411 Generalized anxiety disorder: Secondary | ICD-10-CM

## 2019-12-07 NOTE — Progress Notes (Signed)
      [facility] Counselor/Therapist Progress Note  Patient ID: HONESTY MENTA, MRN: [id number],    Date: 12/07/2019  Time Spent:   Treatment Type: Individual Therapy  Reported Symptoms: sad  Mental Status Exam:  Appearance:   Casual     Behavior:  Appropriate and Sharing  Motor:  Normal  Speech/Language:   Clear and Coherent and Normal Rate  Affect:  Appropriate, Congruent and Tearful  Mood:  sad  Thought process:  normal  Thought content:    Obsessions and Rumination  Sensory/Perceptual disturbances:    WNL  Orientation:  x4  Attention:  Fair  Concentration:  Good  Memory:  WNL  Fund of knowledge:   Good  Insight:    Good  Judgment:   Good  Impulse Control:  Good   Risk Assessment: Danger to Self:  No Self-injurious Behavior: No Danger to Others: No Duty to Warn:no Physical Aggression / Violence:No  Access to Firearms a concern: No  Gang Involvement:No   Subjective: Client reported feeling sad lately as she considers the full meaning of having to prepare for a double mastectomy since shes such high risk for breast cancer. Therapist used MI & grief therapy to support and validate client's participatory emotional grief. Therapist used CBT with client to further process her thoughts shes having that come up with the fear of the surgery and connected to her thoughts about breasts being what "makes her a woman". Therapist assessed for stability, and client denied SI/HI/AVH.  Interventions: Cognitive Behavioral Therapy, Motivational Interviewing and Grief Therapy   Diagnosis:   ICD-10-CM   1. Generalized anxiety disorder  F41.1     Plan of Care:  Client to return for weekly therapy with Zoila Shutter, therapist, to review again in 6 months.  Client to engage in positive self talk and challenging negative internal ruminations and self talk causing client to be overly anxious and worried using CBT, on daily practice. Client to engage in mindfulness: ie body scans  each eveneing to help process and discharge emotional distress & recognize emotions. Client to utilize BSP (brainspotting) with therapist to help client regulate their anxiety in a somatic- felt body sense way: (ie by working to reduce muscle tension, ruminations, increased heart rate, constant worrying and feeling "hyper" ) by decreasing anxiety by 33% in the next 6 months.  Client to understand how to succeed in fighting back worry and embrace life's uncertainty. Client to prioritize sleep 8+ hours each week night AEB going to bed by 10pm each night.  Client participated in the treatment planning of their therapy.  Client agreed with the plan if there is a crisis: contact after hours office line, call 9-1-1 and/or crisis line given by therapist.  [name], LCSW, LCAS, CCTP, CCS-I, BSP

## 2019-12-09 ENCOUNTER — Encounter: Payer: Self-pay | Admitting: Addiction (Substance Use Disorder)

## 2019-12-14 ENCOUNTER — Ambulatory Visit (INDEPENDENT_AMBULATORY_CARE_PROVIDER_SITE_OTHER): Payer: BC Managed Care – PPO | Admitting: Addiction (Substance Use Disorder)

## 2019-12-14 ENCOUNTER — Other Ambulatory Visit: Payer: Self-pay

## 2019-12-14 DIAGNOSIS — F411 Generalized anxiety disorder: Secondary | ICD-10-CM | POA: Diagnosis not present

## 2019-12-14 NOTE — Progress Notes (Signed)
      [facility] Counselor/Therapist Progress Note  Patient ID: Sharon [name], MRN: [id number],    Date: 12/14/2019  Time Spent:   Treatment Type: Individual Therapy  Reported Symptoms: feeling guilty.   Mental Status Exam:  Appearance:   Casual     Behavior:  Appropriate and Sharing  Motor:  Normal  Speech/Language:   Clear and Coherent and Normal Rate  Affect:  Appropriate, Congruent and Tearful  Mood:  anxious and sad  Thought process:  normal  Thought content:    Obsessions and Rumination  Sensory/Perceptual disturbances:    WNL  Orientation:  x4  Attention:  Fair  Concentration:  Good  Memory:  WNL  Fund of knowledge:   Good  Insight:    Good  Judgment:   Good  Impulse Control:  Good   Risk Assessment: Danger to Self:  No Self-injurious Behavior: No Danger to Others: No Duty to Warn:no Physical Aggression / Violence:No  Access to Firearms a concern: No  Gang Involvement:No   Subjective: Client reported struggling with feeling like a failure as a mom, feeling guilty that her children are suffering in a world that's not any nicer than the one she grew up in. Client processed her fears as her kids grow up and get traumatized by her failures and her leftover traumas from her childhood. Client processed the pain/sadness involved in remembering those moments when she was a child and felt unsafe asking her parents to comfort her if she had a nightmare. Therapist used MI & CBT with client to validate her pain and her fears, normalizing her concerns, while also helping to challenge the intrusive thoughts and help her to see how she is different than her parents. Therapist also using Mindfulness to help the client ground in the present, avoiding fear-based future worries. Therapist assessed for stability, and client denied SI/HI/AVH.  Interventions: Cognitive Behavioral Therapy, Mindfulness Meditation and Motivational Interviewing   Diagnosis:   ICD-10-CM   1.  Generalized anxiety disorder  F41.1    Plan of Care:  Client to return for weekly therapy with Zoila Shutter, therapist, to review again in 6 months.  Client to engage in positive self talk and challenging negative internal ruminations and self talk causing client to be overly anxious and worried using CBT, on daily practice. Client to engage in mindfulness: ie body scans each eveneing to help process and discharge emotional distress & recognize emotions. Client to utilize BSP (brainspotting) with therapist to help client regulate their anxiety in a somatic- felt body sense way: (ie by working to reduce muscle tension, ruminations, increased heart rate, constant worrying and feeling "hyper" ) by decreasing anxiety by 33% in the next 6 months.  Client to understand how to succeed in fighting back worry and embrace life's uncertainty. Client to prioritize sleep 8+ hours each week night AEB going to bed by 10pm each night.  Client participated in the treatment planning of their therapy.  Client agreed with the plan if there is a crisis: contact after hours office line, call 9-1-1 and/or crisis line given by therapist.  [name], LCSW, LCAS, CCTP, CCS-I, BSP

## 2019-12-21 ENCOUNTER — Ambulatory Visit: Payer: BC Managed Care – PPO | Admitting: Addiction (Substance Use Disorder)

## 2019-12-28 ENCOUNTER — Ambulatory Visit (INDEPENDENT_AMBULATORY_CARE_PROVIDER_SITE_OTHER): Payer: BC Managed Care – PPO | Admitting: Addiction (Substance Use Disorder)

## 2019-12-28 ENCOUNTER — Encounter: Payer: Self-pay | Admitting: Addiction (Substance Use Disorder)

## 2019-12-28 ENCOUNTER — Other Ambulatory Visit: Payer: Self-pay

## 2019-12-28 DIAGNOSIS — F411 Generalized anxiety disorder: Secondary | ICD-10-CM

## 2019-12-28 NOTE — Progress Notes (Signed)
      [facility] Counselor/Therapist Progress Note  Patient ID: JMYA ULIANO, MRN: [id number],    Date: 12/28/2019  Time Spent:   Treatment Type: Individual Therapy  Reported Symptoms: feeling guilty.   Mental Status Exam:  Appearance:   Casual     Behavior:  Appropriate and Sharing  Motor:  Normal  Speech/Language:   Clear and Coherent and Normal Rate  Affect:  Appropriate, Congruent and Tearful  Mood:  anxious and sad  Thought process:  normal  Thought content:    Obsessions and Rumination  Sensory/Perceptual disturbances:    WNL  Orientation:  x4  Attention:  Fair  Concentration:  Good  Memory:  WNL  Fund of knowledge:   Good  Insight:    Good  Judgment:   Good  Impulse Control:  Good   Risk Assessment: Danger to Self:  No Self-injurious Behavior: No Danger to Others: No Duty to Warn:no Physical Aggression / Violence:No  Access to Firearms a concern: No  Gang Involvement:No   Subjective: Client reported grief that was stirred up since her mom may have gotten COVID. Client's grief and anger coming up, stirring up feelings of anxiety related to losing her relationship with her sisters, something she has fought to protect. Therapist used MI & CBT with client to validate her pain and frustration and thoughts triggered by hearing that her mom is sick. Client processed how grief has caused her to desire relief from the toxicity from her mom & therapist used grief therapy and mindfulness to help client ground and make sense of emotions and desires coming up. Client expressed a desire to process with brainspotting her fears of losing a relationship with her sisters. Therapist assessed for stability, and client denied SI/HI/AVH.  Interventions: Cognitive Behavioral Therapy, Mindfulness Meditation, Motivational Interviewing and Grief Therapy   Diagnosis:   ICD-10-CM   1. Generalized anxiety disorder  F41.1     Plan of Care:  Client to return for weekly therapy  with Zoila Shutter, therapist, to review again in 6 months.  Client to engage in positive self talk and challenging negative internal ruminations and self talk causing client to be overly anxious and worried using CBT, on daily practice. Client to engage in mindfulness: ie body scans each eveneing to help process and discharge emotional distress & recognize emotions. Client to utilize BSP (brainspotting) with therapist to help client regulate their anxiety in a somatic- felt body sense way: (ie by working to reduce muscle tension, ruminations, increased heart rate, constant worrying and feeling "hyper" ) by decreasing anxiety by 33% in the next 6 months.  Client to understand how to succeed in fighting back worry and embrace life's uncertainty. Client to prioritize sleep 8+ hours each week night AEB going to bed by 10pm each night.  Client participated in the treatment planning of their therapy.  Client agreed with the plan if there is a crisis: contact after hours office line, call 9-1-1 and/or crisis line given by therapist.  [name], LCSW, LCAS, CCTP, CCS-I, BSP

## 2020-01-02 DIAGNOSIS — R922 Inconclusive mammogram: Secondary | ICD-10-CM | POA: Diagnosis not present

## 2020-01-02 DIAGNOSIS — N6489 Other specified disorders of breast: Secondary | ICD-10-CM | POA: Diagnosis not present

## 2020-01-02 DIAGNOSIS — N6315 Unspecified lump in the right breast, overlapping quadrants: Secondary | ICD-10-CM | POA: Diagnosis not present

## 2020-01-02 DIAGNOSIS — N6320 Unspecified lump in the left breast, unspecified quadrant: Secondary | ICD-10-CM | POA: Diagnosis not present

## 2020-01-04 ENCOUNTER — Ambulatory Visit (INDEPENDENT_AMBULATORY_CARE_PROVIDER_SITE_OTHER): Payer: BC Managed Care – PPO | Admitting: Addiction (Substance Use Disorder)

## 2020-01-04 DIAGNOSIS — F411 Generalized anxiety disorder: Secondary | ICD-10-CM | POA: Diagnosis not present

## 2020-01-04 NOTE — Progress Notes (Addendum)
[facility] Counselor/Therapist Progress Note  Patient ID: Sharon [name], MRN: [id number],    Date: 01/04/2020  Time Spent:   Treatment Type: Individual Therapy  Reported Symptoms: irritated, annoyed.   Mental Status Exam:  Appearance:   Casual     Behavior:  Appropriate and Sharing  Motor:  Normal  Speech/Language:   Clear and Coherent and Normal Rate  Affect:  Appropriate, Congruent and Tearful  Mood:  anxious and irritable  Thought process:  normal  Thought content:    Obsessions and Rumination  Sensory/Perceptual disturbances:    WNL  Orientation:  x4  Attention:  Fair  Concentration:  Good  Memory:  WNL  Fund of knowledge:   Good  Insight:    Good  Judgment:   Good  Impulse Control:  Good   Risk Assessment: Danger to Self:  No Self-injurious Behavior: No Danger to Others: No Duty to Warn:no Physical Aggression / Violence:No  Access to Firearms a concern: No  Gang Involvement:No   Virtual Visit via VIDEO:  I connected with client by an alternative telemedicine/telehealth application, with their informed consent, and verified client privacy and that I am speaking with the correct person using two identifiers. I discussed the limitations, risks, security and privacy concerns of performing psychotherapy and management service virtually and confirmed their location. I also discussed with the patient that there may be a patient responsible charge related to this service and to confirm with the front desk if their insurance covers teletherapy. I also discussed with the patient the availability of in person appointments. The patient expressed understanding and agreed to proceed. I discussed the treatment planning with the client. The client was provided an opportunity to ask questions and all were answered. The client agreed with the plan and demonstrated an understanding of the instructions. The client was advised to call our office if symptoms worsen or feel  they are in a crisis state and need immediate contact. Client also reminded of a crisis line number and to use 9-1-1 if there's an emergency. Therapist Location: office; Client Location: home.  Subjective: Client reported struggling with anxiety and dysregulation due to daughter's unstopping crying and irritation with her younger brother. Client's patience wearing thin and reported feeling irritated and annoyed by her daughter's behavior and how that affects her day. Therapist used MI & mindfulness with client to support the client in finding ways to regulate herself no matter the children's reactions/misbehaviors. Client also described the anxiety and guilt that comes up when she gets frustrated because she feels bad she gets irritable with her daughter and then worries that her daughter will struggle to be a regulated adult when she grows up. Client struggles with catastrophizing and thinking her daughter will get a personality disorder like client's mom has. Therapist used CBT with client to challenge irrational beliefs and the negative self talk. Therapist assessed for stability, and client denied SI/HI/AVH.  Interventions: Cognitive Behavioral Therapy, Mindfulness Meditation and Motivational Interviewing   Diagnosis:   ICD-10-CM   1. Generalized anxiety disorder  F41.1      Plan of Care:  Client to return for weekly therapy with Zoila Shutter, therapist, to review again in 6 months.  Client to engage in positive self talk and challenging negative internal ruminations and self talk causing client to be overly anxious and worried using CBT, on daily practice. Client to engage in mindfulness: ie body scans each eveneing to help process and discharge emotional distress & recognize  emotions. Client to utilize BSP (brainspotting) with therapist to help client regulate their anxiety in a somatic- felt body sense way: (ie by working to reduce muscle tension, ruminations, increased heart rate, constant  worrying and feeling "hyper" ) by decreasing anxiety by 33% in the next 6 months.  Client to understand how to succeed in fighting back worry and embrace life's uncertainty. Client to prioritize sleep 8+ hours each week night AEB going to bed by 10pm each night.  Client participated in the treatment planning of their therapy.  Client agreed with the plan if there is a crisis: contact after hours office line, call 9-1-1 and/or crisis line given by therapist.  [name], LCSW, LCAS, CCTP, CCS-I, BSP

## 2020-01-11 ENCOUNTER — Encounter: Payer: Self-pay | Admitting: Addiction (Substance Use Disorder)

## 2020-01-11 ENCOUNTER — Ambulatory Visit (INDEPENDENT_AMBULATORY_CARE_PROVIDER_SITE_OTHER): Payer: BC Managed Care – PPO | Admitting: Addiction (Substance Use Disorder)

## 2020-01-11 ENCOUNTER — Other Ambulatory Visit: Payer: Self-pay

## 2020-01-11 DIAGNOSIS — F411 Generalized anxiety disorder: Secondary | ICD-10-CM | POA: Diagnosis not present

## 2020-01-11 NOTE — Progress Notes (Signed)
      [facility] Counselor/Therapist Progress Note  Patient ID: MIKENNA BUNKLEY, MRN: [id number],    Date: 01/11/2020  Time Spent:  Treatment Type: Individual Therapy  Reported Symptoms: tearful, scared  Mental Status Exam:  Appearance:   Casual     Behavior:  Appropriate and Sharing  Motor:  Normal  Speech/Language:   Clear and Coherent and Normal Rate  Affect:  Appropriate, Congruent and Tearful  Mood:  anxious and sad  Thought process:  normal  Thought content:    Obsessions and Rumination  Sensory/Perceptual disturbances:    WNL  Orientation:  x4  Attention:  Fair  Concentration:  Good  Memory:  WNL  Fund of knowledge:   Good  Insight:    Good  Judgment:   Good  Impulse Control:  Good   Risk Assessment: Danger to Self:  No Self-injurious Behavior: No Danger to Others: No Duty to Warn:no Physical Aggression / Violence:No  Access to Firearms a concern: No  Gang Involvement:No   Subjective: Client tearfully shared her news that her mammograms were inconclusive and had possible different tissue than she had before. Client's thoughts went to her worse nighmare: a breast cancer they couldn't save her from. Therapist used MI & BSP with client to support her and help process her fears. Client made progress by identifying a place in her heart she felt some peace while she felt activated (SUDs6/10). Therapist used resource bsp with client to help her experience more peace in her body. Client's peace grew as her hope and trust in God grew. Client reported decreased SUDs to a 2/10.  Therapist assessed for stability, and client denied SI/HI/AVH.  Interventions: Mindfulness Meditation, Motivational Interviewing and BSP   Diagnosis:   ICD-10-CM   1. Generalized anxiety disorder  F41.1     Plan of Care:  Client to return for weekly therapy with Zoila Shutter, therapist, to review again in 6 months.  Client to engage in positive self talk and challenging negative internal  ruminations and self talk causing client to be overly anxious and worried using CBT, on daily practice. Client to engage in mindfulness: ie body scans each eveneing to help process and discharge emotional distress & recognize emotions. Client to utilize BSP (brainspotting) with therapist to help client regulate their anxiety in a somatic- felt body sense way: (ie by working to reduce muscle tension, ruminations, increased heart rate, constant worrying and feeling "hyper" ) by decreasing anxiety by 33% in the next 6 months.  Client to understand how to succeed in fighting back worry and embrace life's uncertainty. Client to prioritize sleep 8+ hours each week night AEB going to bed by 10pm each night.  Client participated in the treatment planning of their therapy.  Client agreed with the plan if there is a crisis: contact after hours office line, call 9-1-1 and/or crisis line given by therapist.  [name], LCSW, LCAS, CCTP, CCS-I, BSP

## 2020-01-18 ENCOUNTER — Other Ambulatory Visit: Payer: Self-pay

## 2020-01-18 ENCOUNTER — Ambulatory Visit (INDEPENDENT_AMBULATORY_CARE_PROVIDER_SITE_OTHER): Payer: BC Managed Care – PPO | Admitting: Addiction (Substance Use Disorder)

## 2020-01-18 DIAGNOSIS — F411 Generalized anxiety disorder: Secondary | ICD-10-CM

## 2020-01-18 NOTE — Progress Notes (Signed)
      [facility] Counselor/Therapist Progress Note  Patient ID: Sharon [name], MRN: [id number],    Date: 01/18/2020  Time Spent:  Treatment Type: Individual Therapy  Reported Symptoms: tearful, annoyed  Mental Status Exam:  Appearance:   Casual     Behavior:  Appropriate and Sharing  Motor:  Normal  Speech/Language:   Clear and Coherent and Normal Rate  Affect:  Appropriate, Congruent and Tearful  Mood:  irritable  Thought process:  normal  Thought content:    Obsessions and Rumination  Sensory/Perceptual disturbances:    WNL  Orientation:  x4  Attention:  Fair  Concentration:  Good  Memory:  WNL  Fund of knowledge:   Good  Insight:    Good  Judgment:   Good  Impulse Control:  Good   Risk Assessment: Danger to Self:  No Self-injurious Behavior: No Danger to Others: No Duty to Warn:no Physical Aggression / Violence:No  Access to Firearms a concern: No  Gang Involvement:No   Subjective: Client frustrated about a lack of an ability for 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 Client discussed the lack of dependibility in the flock and them not willing to submit to God's authority and obey, causing her husband and then her, distress. Client processed her annoyance and lack of control in this situation in contrast with the truths of God. Therapist used MI & CBT with client to support her in processing the frustrations while also helping her to challenge those black and white irritable thoughts.  Therapist assessed for stability, and client denied SI/HI/AVH.  Interventions: Cognitive Behavioral Therapy and Motivational Interviewing   Diagnosis:   ICD-10-CM   1. Generalized anxiety disorder  F41.1      Plan of Care:  Client to return for weekly therapy with Zoila Shutter, therapist, to review again in 6 months.  Client to engage in positive self talk and challenging negative internal ruminations and self talk causing client to be overly anxious  and worried using CBT, on daily practice. Client to engage in mindfulness: ie body scans each eveneing to help process and discharge emotional distress & recognize emotions. Client to utilize BSP (brainspotting) with therapist to help client regulate their anxiety in a somatic- felt body sense way: (ie by working to reduce muscle tension, ruminations, increased heart rate, constant worrying and feeling "hyper" ) by decreasing anxiety by 33% in the next 6 months.  Client to understand how to succeed in fighting back worry and embrace life's uncertainty. Client to prioritize sleep 8+ hours each week night AEB going to bed by 10pm each night.  Client participated in the treatment planning of their therapy.  Client agreed with the plan if there is a crisis: contact after hours office line, call 9-1-1 and/or crisis line given by therapist.  [name], LCSW, LCAS, CCTP, CCS-I, BSP

## 2020-01-20 DIAGNOSIS — Z1239 Encounter for other screening for malignant neoplasm of breast: Secondary | ICD-10-CM | POA: Diagnosis not present

## 2020-01-20 DIAGNOSIS — D241 Benign neoplasm of right breast: Secondary | ICD-10-CM | POA: Diagnosis not present

## 2020-01-20 DIAGNOSIS — F418 Other specified anxiety disorders: Secondary | ICD-10-CM | POA: Diagnosis not present

## 2020-01-20 DIAGNOSIS — Z9189 Other specified personal risk factors, not elsewhere classified: Secondary | ICD-10-CM | POA: Diagnosis not present

## 2020-01-20 DIAGNOSIS — N6321 Unspecified lump in the left breast, upper outer quadrant: Secondary | ICD-10-CM | POA: Diagnosis not present

## 2020-01-20 DIAGNOSIS — Z803 Family history of malignant neoplasm of breast: Secondary | ICD-10-CM | POA: Diagnosis not present

## 2020-01-20 DIAGNOSIS — R922 Inconclusive mammogram: Secondary | ICD-10-CM | POA: Diagnosis not present

## 2020-01-20 DIAGNOSIS — N6312 Unspecified lump in the right breast, upper inner quadrant: Secondary | ICD-10-CM | POA: Diagnosis not present

## 2020-01-20 DIAGNOSIS — R928 Other abnormal and inconclusive findings on diagnostic imaging of breast: Secondary | ICD-10-CM | POA: Diagnosis not present

## 2020-01-20 DIAGNOSIS — D242 Benign neoplasm of left breast: Secondary | ICD-10-CM | POA: Diagnosis not present

## 2020-01-25 ENCOUNTER — Other Ambulatory Visit: Payer: Self-pay

## 2020-01-25 ENCOUNTER — Ambulatory Visit (INDEPENDENT_AMBULATORY_CARE_PROVIDER_SITE_OTHER): Payer: BC Managed Care – PPO | Admitting: Addiction (Substance Use Disorder)

## 2020-01-25 ENCOUNTER — Encounter: Payer: Self-pay | Admitting: Addiction (Substance Use Disorder)

## 2020-01-25 DIAGNOSIS — F411 Generalized anxiety disorder: Secondary | ICD-10-CM

## 2020-01-25 NOTE — Progress Notes (Signed)
      [facility] Counselor/Therapist Progress Note  Patient ID: Sharon [name], MRN: [id number],    Date: 01/25/2020  Time Spent:  Treatment Type: Individual Therapy  Reported Symptoms: overwhelmed.  Mental Status Exam:  Appearance:   Casual     Behavior:  Appropriate and Sharing  Motor:  Normal  Speech/Language:   Clear and Coherent and Normal Rate  Affect:  Appropriate, Congruent and Tearful  Mood:  anxious and sad  Thought process:  normal  Thought content:    Obsessions and Rumination  Sensory/Perceptual disturbances:    WNL  Orientation:  x4  Attention:  Fair  Concentration:  Good  Memory:  WNL  Fund of knowledge:   Good  Insight:    Good  Judgment:   Good  Impulse Control:  Good   Risk Assessment: Danger to Self:  No Self-injurious Behavior: No Danger to Others: No Duty to Warn:no Physical Aggression / Violence:No  Access to Firearms a concern: No  Gang Involvement:No   Subjective: Client processed her feelings of having to be responsible for everyone, to the detriment of herself. Client processed the distress of 8/10 in her chest and therapist used MI & one eyed bsp with client to help her process. Client made progress processing it and identified connections to her 39 yo and 39yo self and traumas that occurred, causing her to feel a need to try to control/be responsible even as a little kid. Client became tearful and found a release reporting reduced SUDS to 1/10 as she compassionately offered her younger self support, reliving her from her "duties".  Therapist assessed for stability, and client denied SI/HI/AVH.  Interventions: Motivational Interviewing and BSP   Diagnosis:   ICD-10-CM   1. Generalized anxiety disorder  F41.1     Plan of Care:  Client to return for weekly therapy with Zoila Shutter, therapist, to review again in 6 months.  Client to engage in positive self talk and challenging negative internal ruminations and self talk causing client  to be overly anxious and worried using CBT, on daily practice. Client to engage in mindfulness: ie body scans each eveneing to help process and discharge emotional distress & recognize emotions. Client to utilize BSP (brainspotting) with therapist to help client regulate their anxiety in a somatic- felt body sense way: (ie by working to reduce muscle tension, ruminations, increased heart rate, constant worrying and feeling "hyper" ) by decreasing anxiety by 33% in the next 6 months.  Client to understand how to succeed in fighting back worry and embrace life's uncertainty. Client to prioritize sleep 8+ hours each week night AEB going to bed by 10pm each night.  Client participated in the treatment planning of their therapy.  Client agreed with the plan if there is a crisis: contact after hours office line, call 9-1-1 and/or crisis line given by therapist.  [name], LCSW, LCAS, CCTP, CCS-I, BSP

## 2020-02-01 ENCOUNTER — Ambulatory Visit (INDEPENDENT_AMBULATORY_CARE_PROVIDER_SITE_OTHER): Payer: BC Managed Care – PPO | Admitting: Addiction (Substance Use Disorder)

## 2020-02-01 ENCOUNTER — Other Ambulatory Visit: Payer: Self-pay

## 2020-02-01 DIAGNOSIS — F411 Generalized anxiety disorder: Secondary | ICD-10-CM | POA: Diagnosis not present

## 2020-02-01 NOTE — Progress Notes (Signed)
°      [facility] Counselor/Therapist Progress Note  Patient ID: Sharon [name], MRN: [id number],    Date: 02/01/2020  Time Spent:  Treatment Type: Individual Therapy  Reported Symptoms: curious.  Mental Status Exam:  Appearance:   Casual and Well Groomed     Behavior:  Appropriate and Sharing  Motor:  Normal  Speech/Language:   Clear and Coherent and Normal Rate  Affect:  Appropriate and Congruent  Mood:  normal  Thought process:  normal  Thought content:    Rumination  Sensory/Perceptual disturbances:    WNL  Orientation:  x4  Attention:  Fair  Concentration:  Good  Memory:  WNL  Fund of knowledge:   Good  Insight:    Good  Judgment:   Good  Impulse Control:  Good   Risk Assessment: Danger to Self:  No Self-injurious Behavior: No Danger to Others: No Duty to Warn:no Physical Aggression / Violence:No  Access to Firearms a concern: No  Gang Involvement:No   Subjective: Client processed her curiosity about 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 Client processed their history with that state and how they have been praying for a time to go. Client somewhat nervous about the big change but looking forward to something they can chose to do together, esp one where they arent stuck at the church she feels her husband is getting "sucked dry at".Client processed how she has been tending to her younger self to allow for compassionate processing of her feelings to alleviate more insomnia. Therapist used MI & mindfulness to continue to support client as she processes her thoughts and feelings and help reflect back to her, her perceived reactions to the thought of going to [place]. Therapist assessed for stability, and client denied SI/HI/AVH.  Interventions: Mindfulness Meditation and Motivational Interviewing   Diagnosis:   ICD-10-CM   1. Generalized anxiety disorder  F41.1      Plan of Care:  Client to return for  weekly therapy with Zoila Shutter, therapist, to review again in 6 months.  Client to engage in positive self talk and challenging negative internal ruminations and self talk causing client to be overly anxious and worried using CBT, on daily practice. Client to engage in mindfulness: ie body scans each eveneing to help process and discharge emotional distress & recognize emotions. Client to utilize BSP (brainspotting) with therapist to help client regulate their anxiety in a somatic- felt body sense way: (ie by working to reduce muscle tension, ruminations, increased heart rate, constant worrying and feeling "hyper" ) by decreasing anxiety by 33% in the next 6 months.  Client to understand how to succeed in fighting back worry and embrace life's uncertainty. Client to prioritize sleep 8+ hours each week night AEB going to bed by 10pm each night.  Client participated in the treatment planning of their therapy.  Client agreed with the plan if there is a crisis: contact after hours office line, call 9-1-1 and/or crisis line given by therapist.  [name], LCSW, LCAS, CCTP, CCS-I, BSP

## 2020-02-23 ENCOUNTER — Ambulatory Visit (INDEPENDENT_AMBULATORY_CARE_PROVIDER_SITE_OTHER): Payer: BC Managed Care – PPO | Admitting: Addiction (Substance Use Disorder)

## 2020-02-23 ENCOUNTER — Encounter: Payer: Self-pay | Admitting: Addiction (Substance Use Disorder)

## 2020-02-23 ENCOUNTER — Other Ambulatory Visit: Payer: Self-pay

## 2020-02-23 DIAGNOSIS — F411 Generalized anxiety disorder: Secondary | ICD-10-CM | POA: Diagnosis not present

## 2020-02-23 NOTE — Progress Notes (Signed)
      [facility] Counselor/Therapist Progress Note  Patient ID: AMBRIEL GORELICK, MRN: [id number],    Date: 02/23/2020  Time Spent:  Treatment Type: Individual Therapy  Reported Symptoms: fearful  Mental Status Exam:  Appearance:   Casual and Well Groomed     Behavior:  Appropriate and Sharing  Motor:  Normal  Speech/Language:   Clear and Coherent and Normal Rate  Affect:  Appropriate and Congruent  Mood:  anxious  Thought process:  normal  Thought content:    Rumination  Sensory/Perceptual disturbances:    WNL  Orientation:  x4  Attention:  Fair  Concentration:  Good  Memory:  WNL  Fund of knowledge:   Good  Insight:    Good  Judgment:   Good  Impulse Control:  Good   Risk Assessment: Danger to Self:  No Self-injurious Behavior: No Danger to Others: No Duty to Warn:no Physical Aggression / Violence:No  Access to Firearms a concern: No  Gang Involvement:No   Subjective: 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 Client processed her fears of looking like a narsacist or hurting her sister's feelings (who wants to read her book, but wont agree with what shes teaching in it about God). Client processed more of her childhood trauma connected to how her parents painted God in a bad light and became tearful/triggered. Therapist used MI & mindfulness to support client and help her ground emotionally, helping her to be kind to her younger self that comes up when the childhood wounds get triggered. Therapist and client also discussed goals for client for her book and solutions & steps for those. Therapist assessed for stability, and client denied SI/HI/AVH.  Interventions: Mindfulness Meditation, Motivational Interviewing and Solution-Oriented/Positive Psychology   Diagnosis:   ICD-10-CM   1. Generalized anxiety disorder  F41.1     Plan of Care:  Client to return for weekly therapy with Zoila Shutter,  therapist, to review again in 6 months.  Client to engage in positive self talk and challenging negative internal ruminations and self talk causing client to be overly anxious and worried using CBT, on daily practice. Client to engage in mindfulness: ie body scans each eveneing to help process and discharge emotional distress & recognize emotions. Client to utilize BSP (brainspotting) with therapist to help client regulate their anxiety in a somatic- felt body sense way: (ie by working to reduce muscle tension, ruminations, increased heart rate, constant worrying and feeling "hyper" ) by decreasing anxiety by 33% in the next 6 months.  Client to understand how to succeed in fighting back worry and embrace life's uncertainty. Client to prioritize sleep 8+ hours each week night AEB going to bed by 10pm each night.  Client participated in the treatment planning of their therapy.  Client agreed with the plan if there is a crisis: contact after hours office line, call 9-1-1 and/or crisis line given by therapist.  [name], LCSW, LCAS, CCTP, CCS-I, BSP

## 2020-02-29 ENCOUNTER — Ambulatory Visit: Payer: BC Managed Care – PPO | Admitting: Addiction (Substance Use Disorder)

## 2020-03-07 ENCOUNTER — Ambulatory Visit: Payer: BC Managed Care – PPO | Admitting: Addiction (Substance Use Disorder)

## 2020-03-15 ENCOUNTER — Ambulatory Visit (INDEPENDENT_AMBULATORY_CARE_PROVIDER_SITE_OTHER): Payer: BC Managed Care – PPO | Admitting: Addiction (Substance Use Disorder)

## 2020-03-15 ENCOUNTER — Other Ambulatory Visit: Payer: Self-pay

## 2020-03-15 ENCOUNTER — Encounter: Payer: Self-pay | Admitting: Addiction (Substance Use Disorder)

## 2020-03-15 DIAGNOSIS — F411 Generalized anxiety disorder: Secondary | ICD-10-CM

## 2020-03-15 NOTE — Progress Notes (Signed)
      [facility] Counselor/Therapist Progress Note  Patient ID: Sharon [name], MRN: [id number],    Date: 03/15/2020  Time Spent:  Treatment Type: Individual Therapy  Reported Symptoms: guilty, frustrated  Mental Status Exam:  Appearance:   Casual     Behavior:  Appropriate and Sharing  Motor:  Normal  Speech/Language:   Clear and Coherent and Normal Rate  Affect:  Appropriate and Congruent  Mood:  anxious and irritable  Thought process:  normal  Thought content:    Rumination  Sensory/Perceptual disturbances:    WNL  Orientation:  x4  Attention:  Fair  Concentration:  Good  Memory:  WNL  Fund of knowledge:   Good  Insight:    Good  Judgment:   Good  Impulse Control:  Good   Risk Assessment: Danger to Self:  No Self-injurious Behavior: No Danger to Others: No Duty to Warn:no Physical Aggression / Violence:No  Access to Firearms a concern: No  Gang Involvement:No   Subjective: Client processed a need to process a limbic counter-transference issue with a client who blamed her for not "caring enough" that left her feeling guilty, frustrated, and feeling misunderstood. Therapist used MI & BSP (inside window) to process the activation she felt around those emotions and the feeling of a tight chest. Client made progress processing and her SUDs reduced 5 points to a 1/10 from a 6/10. Client also made progress identifying the childhood wound that was being triggered that brought her such a strong reaction to the client's blaming statement about her. 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 Therapist assessed for stability, and client denied SI/HI/AVH.  Interventions: Motivational Interviewing and BSP   Diagnosis:   ICD-10-CM   1. Generalized anxiety disorder  F41.1     Plan of Care:  Client to return for weekly therapy with Zoila Shutter, therapist, to review  again in 6 months.  Client to engage in positive self talk and challenging negative internal ruminations and self talk causing client to be overly anxious and worried using CBT, on daily practice. Client to engage in mindfulness: ie body scans each eveneing to help process and discharge emotional distress & recognize emotions. Client to utilize BSP (brainspotting) with therapist to help client regulate their anxiety in a somatic- felt body sense way: (ie by working to reduce muscle tension, ruminations, increased heart rate, constant worrying and feeling "hyper" ) by decreasing anxiety by 33% in the next 6 months.  Client to understand how to succeed in fighting back worry and embrace life's uncertainty. Client to prioritize sleep 8+ hours each week night AEB going to bed by 10pm each night.  Client participated in the treatment planning of their therapy.  Client agreed with the plan if there is a crisis: contact after hours office line, call 9-1-1 and/or crisis line given by therapist.  [name], LCSW, LCAS, CCTP, CCS-I, BSP

## 2020-03-21 ENCOUNTER — Ambulatory Visit (INDEPENDENT_AMBULATORY_CARE_PROVIDER_SITE_OTHER): Payer: BC Managed Care – PPO | Admitting: Addiction (Substance Use Disorder)

## 2020-03-21 ENCOUNTER — Other Ambulatory Visit: Payer: Self-pay

## 2020-03-21 DIAGNOSIS — F411 Generalized anxiety disorder: Secondary | ICD-10-CM

## 2020-03-21 NOTE — Progress Notes (Signed)
      [facility] Counselor/Therapist Progress Note  Patient ID: RANDA RISS, MRN: [id number],    Date: 03/21/2020  Time Spent: 60 mins  Treatment Type: Individual Therapy  Reported Symptoms: feeling misunderstood  Mental Status Exam:  Appearance:   Casual     Behavior:  Appropriate and Sharing  Motor:  Normal  Speech/Language:   Clear and Coherent and Normal Rate  Affect:  Appropriate and Congruent  Mood:  anxious and sad  Thought process:  normal  Thought content:    Obsessions and Rumination  Sensory/Perceptual disturbances:    WNL  Orientation:  x4  Attention:  Fair  Concentration:  Good  Memory:  WNL  Fund of knowledge:   Good  Insight:    Fair  Judgment:   Good  Impulse Control:  Good   Risk Assessment: Danger to Self:  No Self-injurious Behavior: No Danger to Others: No Duty to Warn:no Physical Aggression / Violence:No  Access to Firearms a concern: No  Gang Involvement:No   Subjective: Client processed an ongoing issue with her client who is lying to her about her resentment towards her for raising her therapy rate. Client described how frustrated she feels and how it brings her such anxiety to feel so misunderstood. Client reported finding her client's online blog written about her (the client's therapist) in a negative manner and processed her grief/frustration. Therapist used MI & grief therapy to support client and help her process the pain of being misunderstood while also using SFT to help client think through appropriate ways to discuss issues with her client and ways to draw a boundary and possibly refer the client out if need be. Therapist assessed for stability, and client denied SI/HI/AVH.  Interventions: Motivational Interviewing, Solution-Oriented/Positive Psychology and Grief Therapy   Diagnosis:   ICD-10-CM   1. Generalized anxiety disorder  F41.1     Plan of Care:  Client to return for weekly therapy with Zoila Shutter, therapist, to review  again in 6 months.  Client to engage in positive self talk and challenging negative internal ruminations and self talk causing client to be overly anxious and worried using CBT, on daily practice. Client to engage in mindfulness: ie body scans each eveneing to help process and discharge emotional distress & recognize emotions. Client to utilize BSP (brainspotting) with therapist to help client regulate their anxiety in a somatic- felt body sense way: (ie by working to reduce muscle tension, ruminations, increased heart rate, constant worrying and feeling "hyper" ) by decreasing anxiety by 33% in the next 6 months.  Client to understand how to succeed in fighting back worry and embrace life's uncertainty. Client to prioritize sleep 8+ hours each week night AEB going to bed by 10pm each night.  Client participated in the treatment planning of their therapy.  Client agreed with the plan if there is a crisis: contact after hours office line, call 9-1-1 and/or crisis line given by therapist.  [name], LCSW, LCAS, CCTP, CCS-I, BSP

## 2020-03-26 DIAGNOSIS — Z803 Family history of malignant neoplasm of breast: Secondary | ICD-10-CM | POA: Diagnosis not present

## 2020-03-26 DIAGNOSIS — Z23 Encounter for immunization: Secondary | ICD-10-CM | POA: Diagnosis not present

## 2020-03-26 DIAGNOSIS — Z9189 Other specified personal risk factors, not elsewhere classified: Secondary | ICD-10-CM | POA: Diagnosis not present

## 2020-03-29 ENCOUNTER — Ambulatory Visit (INDEPENDENT_AMBULATORY_CARE_PROVIDER_SITE_OTHER): Payer: BC Managed Care – PPO | Admitting: Addiction (Substance Use Disorder)

## 2020-03-29 ENCOUNTER — Other Ambulatory Visit: Payer: Self-pay

## 2020-03-29 DIAGNOSIS — F411 Generalized anxiety disorder: Secondary | ICD-10-CM | POA: Diagnosis not present

## 2020-03-29 DIAGNOSIS — F428 Other obsessive-compulsive disorder: Secondary | ICD-10-CM

## 2020-03-29 NOTE — Progress Notes (Signed)
      [facility] Counselor/Therapist Progress Note  Patient ID: Sharon [name], MRN: [id number],    Date: 03/29/2020  Time Spent:  Treatment Type: Individual Therapy  Reported Symptoms: sad anxious  Mental Status Exam:  Appearance:   Casual     Behavior:  Appropriate and Sharing  Motor:  Normal  Speech/Language:   Clear and Coherent and Normal Rate  Affect:  Appropriate and Congruent  Mood:  anxious and sad  Thought process:  normal  Thought content:    Obsessions and Rumination  Sensory/Perceptual disturbances:    WNL  Orientation:  x4  Attention:  Fair  Concentration:  Good  Memory:  WNL  Fund of knowledge:   Good  Insight:    Good  Judgment:   Good  Impulse Control:  Good   Risk Assessment: Danger to Self:  No Self-injurious Behavior: No Danger to Others: No Duty to Warn:no Physical Aggression / Violence:No  Access to Firearms a concern: No  Gang Involvement:No   Subjective: Client processed a continuous  issue with a client that made it so that she had to terminate with a client due to what the client perceived as an emotional and financial burden to her (staying in therapy with her counselor- my client). Client processed having had a panic attack due to client's defamation google review against her and the intense anxiety it brought up since having lost her license before due to a mistake of the board, but that still affected her livelihood. Client processed more of her obsessive ruminations causing her to overthink every detail of how she professionally terminated with her client and therapist used MI & CBT with client to support her, reassure her of her good judgement and attempts to maintain a therapeutic rapport until it was no longer serving the client. Therapist also used CBT and MI affirmations with client to validate her strengths and challenge negative thoughts about her professional self. Therapist assessed for stability, and client denied  SI/HI/AVH.  Interventions: Cognitive Behavioral Therapy and Motivational Interviewing   Diagnosis:   ICD-10-CM   1. Generalized anxiety disorder  F41.1   2. Obsessional thoughts  F42.8     Plan of Care:  Client to return for weekly therapy with Zoila Shutter, therapist, to review again in 6 months.  Client to engage in positive self talk and challenging negative internal ruminations and self talk causing client to be overly anxious and worried using CBT, on daily practice. Client to engage in mindfulness: ie body scans each eveneing to help process and discharge emotional distress & recognize emotions. Client to utilize BSP (brainspotting) with therapist to help client regulate their anxiety in a somatic- felt body sense way: (ie by working to reduce muscle tension, ruminations, increased heart rate, constant worrying and feeling "hyper" ) by decreasing anxiety by 33% in the next 6 months.  Client to understand how to succeed in fighting back worry and embrace life's uncertainty. Client to prioritize sleep 8+ hours each week night AEB going to bed by 10pm each night.  Client participated in the treatment planning of their therapy.  Client agreed with the plan if there is a crisis: contact after hours office line, call 9-1-1 and/or crisis line given by therapist.  [name], LCSW, LCAS, CCTP, CCS-I, BSP

## 2020-04-12 ENCOUNTER — Other Ambulatory Visit: Payer: Self-pay

## 2020-04-12 ENCOUNTER — Ambulatory Visit (INDEPENDENT_AMBULATORY_CARE_PROVIDER_SITE_OTHER): Payer: BC Managed Care – PPO | Admitting: Addiction (Substance Use Disorder)

## 2020-04-12 DIAGNOSIS — F411 Generalized anxiety disorder: Secondary | ICD-10-CM | POA: Diagnosis not present

## 2020-04-12 NOTE — Progress Notes (Unsigned)
      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  Mental Status Exam:  Appearance:   Casual     Behavior:  Appropriate and Sharing  Motor:  Normal  Speech/Language:   Clear and Coherent and Normal Rate  Affect:  Appropriate and Congruent  Mood:  anxious and sad  Thought process:  normal  Thought content:    Obsessions and Rumination  Sensory/Perceptual disturbances:    WNL  Orientation:  x4  Attention:  Fair  Concentration:  Good  Memory:  WNL  Fund of knowledge:   Good  Insight:    Good  Judgment:   Good  Impulse Control:  Good   Risk Assessment: Danger to Self:  No Self-injurious Behavior: No Danger to Others: No Duty to Warn:no Physical Aggression / Violence:No  Access to Firearms a concern: No  Gang Involvement:No   Subjective: 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 Client processed her past experiences as a child where she felt God's presence and was able to talk/lament to him/ cry out to him. Client processed the balance between counseling and instilling hope spiritually. Client processed her longing for her clients to know the kindness of the lord. Therapist used MI to validate client's grief but also support her in finding ways she can integrate gifts of hers into her client sessions. Therapist assessed for stability, and client denied SI/HI/AVH.  Interventions: Motivational Interviewing   Diagnosis:   ICD-10-CM   1. Generalized anxiety disorder  F41.1     Plan of Care:  Client to return for weekly therapy with Zoila Shutter, therapist, to review again in 6 months.  Client to engage in positive self talk and challenging negative internal ruminations and self  talk causing client to be overly anxious and worried using CBT, on daily practice. Client to engage in mindfulness: ie body scans each eveneing to help process and discharge emotional distress & recognize emotions. Client to utilize BSP (brainspotting) with therapist to help client regulate their anxiety in a somatic- felt body sense way: (ie by working to reduce muscle tension, ruminations, increased heart rate, constant worrying and feeling "hyper" ) by decreasing anxiety by 33% in the next 6 months.  Client to understand how to succeed in fighting back worry and embrace life's uncertainty. Client to prioritize sleep 8+ hours each week night AEB going to bed by 10pm each night.  Client participated in the treatment planning of their therapy.  Client agreed with the plan if there is a crisis: contact after hours office line, call 9-1-1 and/or crisis line given by therapist.  [name], LCSW, LCAS, CCTP, CCS-I, BSP

## 2020-04-25 ENCOUNTER — Other Ambulatory Visit: Payer: Self-pay

## 2020-04-25 ENCOUNTER — Ambulatory Visit (INDEPENDENT_AMBULATORY_CARE_PROVIDER_SITE_OTHER): Payer: BC Managed Care – PPO | Admitting: Addiction (Substance Use Disorder)

## 2020-04-25 DIAGNOSIS — F411 Generalized anxiety disorder: Secondary | ICD-10-CM | POA: Diagnosis not present

## 2020-04-25 NOTE — Progress Notes (Signed)
      [facility] Counselor/Therapist Progress Note  Patient ID: Sharon [name], MRN: [id number],    Date: 04/25/2020  Time Spent:  Treatment Type: Individual Therapy  Reported Symptoms: rumination, feeling misunderstood  Mental Status Exam:  Appearance:   Casual     Behavior:  Appropriate and Sharing  Motor:  Normal  Speech/Language:   Clear and Coherent and Normal Rate  Affect:  Appropriate and Congruent  Mood:  sad  Thought process:  normal  Thought content:    Obsessions and Rumination  Sensory/Perceptual disturbances:    WNL  Orientation:  x4  Attention:  Fair  Concentration:  Good  Memory:  WNL  Fund of knowledge:   Good  Insight:    Good  Judgment:   Good  Impulse Control:  Good   Risk Assessment: Danger to Self:  No Self-injurious Behavior: No Danger to Others: No Duty to Warn:no Physical Aggression / Violence:No  Access to Firearms a concern: No  Gang Involvement:No   Subjective: Client processed her frustration and sadness related to feeling misunderstood by her mom, her sister, and others in her life.  Client also reported her felt anxiety was at a 4 out of 10 due to the board meeting and shes concerned her client made a formal compliant about her. Client managing that anxiety well but still stuck feeling a need to question herself following their interaction, along with the interaction with her mom on the phone. Client reported situations causing her so much self-doubt that dysregulated her to such a point. Therapist used MI & BSP and client made progress reducing her SUDS in her chest from a 8/10 to a 1/10 related to being misunderstood. Therapist assessed for stability, and client denied SI/HI/AVH.  Interventions: Motivational Interviewing and BSP   Diagnosis:   ICD-10-CM   1. Generalized anxiety disorder  F41.1     Plan of Care:  Client to return for weekly therapy with Zoila Shutter, therapist, to review again in 6 months.  Client to engage in  positive self talk and challenging negative internal ruminations and self talk causing client to be overly anxious and worried using CBT, on daily practice. Client to engage in mindfulness: ie body scans each eveneing to help process and discharge emotional distress & recognize emotions. Client to utilize BSP (brainspotting) with therapist to help client regulate their anxiety in a somatic- felt body sense way: (ie by working to reduce muscle tension, ruminations, increased heart rate, constant worrying and feeling "hyper" ) by decreasing anxiety by 33% in the next 6 months.  Client to understand how to succeed in fighting back worry and embrace life's uncertainty. Client to prioritize sleep 8+ hours each week night AEB going to bed by 10pm each night.  Client participated in the treatment planning of their therapy.  Client agreed with the plan if there is a crisis: contact after hours office line, call 9-1-1 and/or crisis line given by therapist.  [name], LCSW, LCAS, CCTP, CCS-I, BSP

## 2020-05-03 ENCOUNTER — Ambulatory Visit (INDEPENDENT_AMBULATORY_CARE_PROVIDER_SITE_OTHER): Payer: BC Managed Care – PPO | Admitting: Addiction (Substance Use Disorder)

## 2020-05-03 ENCOUNTER — Other Ambulatory Visit: Payer: Self-pay

## 2020-05-03 DIAGNOSIS — F428 Other obsessive-compulsive disorder: Secondary | ICD-10-CM

## 2020-05-03 DIAGNOSIS — F411 Generalized anxiety disorder: Secondary | ICD-10-CM | POA: Diagnosis not present

## 2020-05-03 NOTE — Progress Notes (Signed)
      [facility] Counselor/Therapist Progress Note  Patient ID: Sharon [name], MRN: [id number],    Date: 05/03/2020  Time Spent:  Treatment Type: Individual Therapy  Reported Symptoms: tearful, self-critical  Mental Status Exam:  Appearance:   Casual     Behavior:  Appropriate and Sharing  Motor:  Normal  Speech/Language:   Clear and Coherent and Normal Rate  Affect:  Appropriate and Congruent  Mood:  anxious and labile  Thought process:  normal  Thought content:    Obsessions and Rumination  Sensory/Perceptual disturbances:    WNL  Orientation:  x4  Attention:  Fair  Concentration:  Good  Memory:  WNL  Fund of knowledge:   Good  Insight:    Fair  Judgment:   Good  Impulse Control:  Good   Risk Assessment: Danger to Self:  No Self-injurious Behavior: No Danger to Others: No Duty to Warn:no Physical Aggression / Violence:No  Access to Firearms a concern: No  Gang Involvement:No   Subjective: Client processed feeling labile and tearful a lot lately as she thinks about aging. Client reported not being happy with "who she is not who I want to be". Client unsure of the exact trigger of that thought about herself. Therapist used MI to explore that with client in a supportive way and BSP to help client work through the shame of feeling like a double-minded person whos not sensitive to the spirit 's work in her heart. Client made progress identifying her tendency to always compare herself to "the" measuring stick and found room in her head where she didn't compare herself. Therapist assessed for stability, and client denied SI/HI/AVH.  Interventions: Motivational Interviewing and BSP   Diagnosis:   ICD-10-CM   1. Obsessional thoughts  F42.8   2. Generalized anxiety disorder  F41.1      Plan of Care:  Client to return for weekly therapy with Sharon [name], therapist, to review again in 6 months.  Client to engage in positive self talk and challenging negative internal  ruminations and self talk causing client to be overly anxious and worried using CBT, on daily practice. Client to engage in mindfulness: ie body scans each eveneing to help process and discharge emotional distress & recognize emotions. Client to utilize BSP (brainspotting) with therapist to help client regulate their anxiety in a somatic- felt body sense way: (ie by working to reduce muscle tension, ruminations, increased heart rate, constant worrying and feeling "hyper" ) by decreasing anxiety by 33% in the next 6 months.  Client to understand how to succeed in fighting back worry and embrace life's uncertainty. Client to prioritize sleep 8+ hours each week night AEB going to bed by 10pm each night.  Client participated in the treatment planning of their therapy.  Client agreed with the plan if there is a crisis: contact after hours office line, call 9-1-1 and/or crisis line given by therapist.  [name], LCSW, LCAS, CCTP, CCS-I, BSP

## 2020-05-09 ENCOUNTER — Other Ambulatory Visit: Payer: Self-pay

## 2020-05-09 ENCOUNTER — Ambulatory Visit (INDEPENDENT_AMBULATORY_CARE_PROVIDER_SITE_OTHER): Payer: BC Managed Care – PPO | Admitting: Addiction (Substance Use Disorder)

## 2020-05-09 DIAGNOSIS — F411 Generalized anxiety disorder: Secondary | ICD-10-CM

## 2020-05-09 DIAGNOSIS — F428 Other obsessive-compulsive disorder: Secondary | ICD-10-CM | POA: Diagnosis not present

## 2020-05-09 NOTE — Progress Notes (Signed)
      [facility] Counselor/Therapist Progress Note  Patient ID: LANISSA CASHEN, MRN: [id number],    Date: 05/09/2020  Time Spent:  Treatment Type: Individual Therapy  Reported Symptoms: overwhelmed  Mental Status Exam:  Appearance:   Casual     Behavior:  Appropriate and Sharing  Motor:  Normal  Speech/Language:   Clear and Coherent and Normal Rate  Affect:  Appropriate and Congruent  Mood:  sad  Thought process:  normal  Thought content:    Obsessions and Rumination  Sensory/Perceptual disturbances:    WNL  Orientation:  x4  Attention:  Fair  Concentration:  Good  Memory:  WNL  Fund of knowledge:   Good  Insight:    Fair  Judgment:   Good  Impulse Control:  Good   Risk Assessment: Danger to Self:  No Self-injurious Behavior: No Danger to Others: No Duty to Warn:no Physical Aggression / Violence:No  Access to Firearms a concern: No  Gang Involvement:No   Subjective: Client processed feeling heavy. Client shared that her friend named for her how she feels: such a heavy weight from the pain of this world.  The moment her friend said: "I think this world is too heavy for me to bear the pain of", the client broek down in tears. Client struggles with being an empath & absorbing the weight of this world. Therpaist used MI & CBT with client to support her and encourage her to continue to cling to her faith: that there is hope even when we feel powerless to change the world events. Client made progress thinking of situations she wants to empty herself and call on the Lord to take her burdens.  Therapist assessed for stability, and client denied SI/HI/AVH.  Interventions: Cognitive Behavioral Therapy and Motivational Interviewing   Diagnosis:   ICD-10-CM   1. Obsessional thoughts  F42.8   2. Generalized anxiety disorder  F41.1     Plan of Care:  Client to return for weekly therapy with Zoila Shutter, therapist, to review again in 6 months.  Client to engage in positive  self talk and challenging negative internal ruminations and self talk causing client to be overly anxious and worried using CBT, on daily practice. Client to engage in mindfulness: ie body scans each eveneing to help process and discharge emotional distress & recognize emotions. Client to utilize BSP (brainspotting) with therapist to help client regulate their anxiety in a somatic- felt body sense way: (ie by working to reduce muscle tension, ruminations, increased heart rate, constant worrying and feeling "hyper" ) by decreasing anxiety by 33% in the next 6 months.  Client to understand how to succeed in fighting back worry and embrace life's uncertainty. Client to prioritize sleep 8+ hours each week night AEB going to bed by 10pm each night.  Client participated in the treatment planning of their therapy.  Client agreed with the plan if there is a crisis: contact after hours office line, call 9-1-1 and/or crisis line given by therapist.  [name], LCSW, LCAS, CCTP, CCS-I, BSP

## 2020-05-17 ENCOUNTER — Ambulatory Visit (INDEPENDENT_AMBULATORY_CARE_PROVIDER_SITE_OTHER): Payer: BC Managed Care – PPO | Admitting: Addiction (Substance Use Disorder)

## 2020-05-17 ENCOUNTER — Other Ambulatory Visit: Payer: Self-pay

## 2020-05-17 DIAGNOSIS — F428 Other obsessive-compulsive disorder: Secondary | ICD-10-CM

## 2020-05-17 NOTE — Progress Notes (Signed)
      [facility] Counselor/Therapist Progress Note  Patient ID: Sharon [name], MRN: [id number],    Date: 05/17/2020  Time Spent:  Treatment Type: Individual Therapy  Reported Symptoms: thoughtful and trying to protect her mental space more from family.   Mental Status Exam:  Appearance:   Casual and Well Groomed     Behavior:  Appropriate and Sharing  Motor:  Normal  Speech/Language:   Clear and Coherent and Normal Rate  Affect:  Appropriate and Congruent  Mood:  normal  Thought process:  normal  Thought content:    Obsessions and Rumination  Sensory/Perceptual disturbances:    WNL  Orientation:  x4  Attention:  Good  Concentration:  Good  Memory:  WNL  Fund of knowledge:   Good  Insight:    Good  Judgment:   Good  Impulse Control:  Good   Risk Assessment: Danger to Self:  No Self-injurious Behavior: No Danger to Others: No Duty to Warn:no Physical Aggression / Violence:No  Access to Firearms a concern: No  Gang Involvement:No   Subjective: Client processed making progress in her ability to deal with her family and trying to compartmentalize her family problems. Client working on not taking on her family's stressors and having to be the [employer]. Therapist used MI & SFT with client to mindfully help her consider how to engage with them and regulate herself to keep from getting sucked into feeling responsible for her family's wellbeing. Client discussed more about her need for more community spiritually due to the lack of it at the church her husband pastors at. Therapist assessed for stability, and client denied SI/HI/AVH.  Interventions: Motivational Interviewing and Solution-Oriented/Positive Psychology   Diagnosis:   ICD-10-CM   1. Obsessional thoughts  F42.8      Plan of Care:  Client to return for weekly therapy with Zoila Shutter, therapist, to review again in 6 months.  Client to engage in positive self talk and challenging negative internal ruminations  and self talk causing client to be overly anxious and worried using CBT, on daily practice. Client to engage in mindfulness: ie body scans each eveneing to help process and discharge emotional distress & recognize emotions. Client to utilize BSP (brainspotting) with therapist to help client regulate their anxiety in a somatic- felt body sense way: (ie by working to reduce muscle tension, ruminations, increased heart rate, constant worrying and feeling "hyper" ) by decreasing anxiety by 33% in the next 6 months.  Client to understand how to succeed in fighting back worry and embrace life's uncertainty. Client to prioritize sleep 8+ hours each week night AEB going to bed by 10pm each night.  Client participated in the treatment planning of their therapy.  Client agreed with the plan if there is a crisis: contact after hours office line, call 9-1-1 and/or crisis line given by therapist.  [name], LCSW, LCAS, CCTP, CCS-I, BSP

## 2020-05-23 ENCOUNTER — Ambulatory Visit: Payer: BC Managed Care – PPO | Admitting: Addiction (Substance Use Disorder)

## 2020-05-30 ENCOUNTER — Encounter: Payer: Self-pay | Admitting: Behavioral Health

## 2020-05-30 ENCOUNTER — Other Ambulatory Visit: Payer: Self-pay

## 2020-05-30 ENCOUNTER — Ambulatory Visit (INDEPENDENT_AMBULATORY_CARE_PROVIDER_SITE_OTHER): Payer: BC Managed Care – PPO | Admitting: Behavioral Health

## 2020-05-30 VITALS — BP 126/77 | HR 63 | Ht 66.0 in | Wt 175.0 lb

## 2020-05-30 DIAGNOSIS — F3341 Major depressive disorder, recurrent, in partial remission: Secondary | ICD-10-CM

## 2020-05-30 MED ORDER — FLUOXETINE HCL 40 MG PO CAPS: 40.0000 mg | ORAL_CAPSULE | Freq: Every day | ORAL | 1 refills | Status: DC

## 2020-05-30 MED ORDER — BUPROPION HCL ER (XL) 150 MG PO TB24: 150.0000 mg | ORAL_TABLET | Freq: Every day | ORAL | 1 refills | Status: DC
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
## 2020-06-07 ENCOUNTER — Other Ambulatory Visit: Payer: Self-pay

## 2020-06-07 ENCOUNTER — Ambulatory Visit (INDEPENDENT_AMBULATORY_CARE_PROVIDER_SITE_OTHER): Payer: BC Managed Care – PPO | Admitting: Addiction (Substance Use Disorder)

## 2020-06-07 DIAGNOSIS — F428 Other obsessive-compulsive disorder: Secondary | ICD-10-CM | POA: Diagnosis not present

## 2020-06-07 DIAGNOSIS — F411 Generalized anxiety disorder: Secondary | ICD-10-CM | POA: Diagnosis not present

## 2020-06-07 NOTE — Progress Notes (Signed)
      [facility] Counselor/Therapist Progress Note  Patient ID: Sharon [name], MRN: [id number],    Date: 06/07/2020  Time Spent:  Treatment Type: Individual Therapy  Reported Symptoms: guilty, ashamed, frustrated.   Mental Status Exam:  Appearance:   Casual and Well Groomed     Behavior:  Appropriate and Sharing  Motor:  Normal  Speech/Language:   Clear and Coherent and Normal Rate  Affect:  Appropriate and Congruent  Mood:  angry, anxious and sad  Thought process:  normal  Thought content:    Obsessions and Rumination  Sensory/Perceptual disturbances:    WNL  Orientation:  x4  Attention:  Good  Concentration:  Good  Memory:  WNL  Fund of knowledge:   Good  Insight:    Good  Judgment:   Good  Impulse Control:  Good   Risk Assessment: Danger to Self:  No Self-injurious Behavior: No Danger to Others: No Duty to Warn:no Physical Aggression / Violence:No  Access to Firearms a concern: No  Gang Involvement:No   Subjective: Client processed struggling with meeting other people's needs before evaluating and making sure to meet her own needs. Client reported how this is causing her harm and making her feel guilty and even ashamed of who she is if she cant meet their standards. Therapist used MI & BSP with client to support her in processing her pain connected to thoughts of npot being enough for others. Therapist worked with client to help her challenge those irrational thoughts and get to a place of understanding why she feels pressured to do for others and ignore her own needs. Therapist made progress in this in BSP. Therapist assessed for stability, and client denied SI/HI/AVH.  Interventions: Motivational Interviewing and BSP & CBT   Diagnosis:   ICD-10-CM   1. Generalized anxiety disorder  F41.1   2. Obsessional thoughts  F42.8      Plan of Care:  Client to return for weekly therapy with Zoila Shutter, therapist, to review again in 6 months.  Client to engage in  positive self talk and challenging negative internal ruminations and self talk causing client to be overly anxious and worried using CBT, on daily practice. Client to engage in mindfulness: ie body scans each eveneing to help process and discharge emotional distress & recognize emotions. Client to utilize BSP (brainspotting) with therapist to help client regulate their anxiety in a somatic- felt body sense way: (ie by working to reduce muscle tension, ruminations, increased heart rate, constant worrying and feeling "hyper" ) by decreasing anxiety by 33% in the next 6 months.  Client to understand how to succeed in fighting back worry and embrace life's uncertainty. Client to prioritize sleep 8+ hours each week night AEB going to bed by 10pm each night.  Client participated in the treatment planning of their therapy.  Client agreed with the plan if there is a crisis: contact after hours office line, call 9-1-1 and/or crisis line given by therapist.  [name], LCSW, LCAS, CCTP, CCS-I, BSP

## 2020-06-20 DIAGNOSIS — H16223 Keratoconjunctivitis sicca, not specified as Sjogren's, bilateral: Secondary | ICD-10-CM | POA: Diagnosis not present

## 2020-06-21 ENCOUNTER — Other Ambulatory Visit: Payer: Self-pay

## 2020-06-21 ENCOUNTER — Other Ambulatory Visit: Payer: Self-pay | Admitting: Behavioral Health

## 2020-06-21 ENCOUNTER — Ambulatory Visit (INDEPENDENT_AMBULATORY_CARE_PROVIDER_SITE_OTHER): Payer: BC Managed Care – PPO | Admitting: Addiction (Substance Use Disorder)

## 2020-06-21 DIAGNOSIS — F428 Other obsessive-compulsive disorder: Secondary | ICD-10-CM

## 2020-06-21 DIAGNOSIS — F3341 Major depressive disorder, recurrent, in partial remission: Secondary | ICD-10-CM

## 2020-06-21 DIAGNOSIS — F411 Generalized anxiety disorder: Secondary | ICD-10-CM

## 2020-06-21 NOTE — Progress Notes (Signed)
      [facility] Counselor/Therapist Progress Note  Patient ID: Sharon [name], MRN: [id number],    Date: 06/21/2020  Time Spent:  Treatment Type: Individual Therapy  Reported Symptoms:  Guilty, hopeless  Mental Status Exam:  Appearance:   Casual and Well Groomed     Behavior:  Appropriate and Sharing  Motor:  Normal  Speech/Language:   Clear and Coherent and Normal Rate  Affect:  Appropriate, Congruent and Tearful  Mood:  anxious, labile and sad  Thought process:  normal  Thought content:    Obsessions and Rumination  Sensory/Perceptual disturbances:    WNL  Orientation:  x4  Attention:  Good  Concentration:  Good  Memory:  WNL  Fund of knowledge:   Good  Insight:    Good  Judgment:   Good  Impulse Control:  Good   Risk Assessment: Danger to Self:  No Self-injurious Behavior: No Danger to Others: No Duty to Warn:no Physical Aggression / Violence:No  Access to Firearms a concern: No  Gang Involvement:No   Subjective: Client came in crying, upset and feeling defeated. Client processed feeling helpless when it comes to reassuring her daughter perfectly of her love for her. Client's daughter telling client multiple times a day: "you hate me; you dont love me mom!". Client processed her childhood and how she could never process how she felt the same as a child as her BPD mom didn't care if her kids felt like she hated them. Therapist used MI, uncondiontal positive regard, and BSP to support client in processing her fears for her daughters, pain of her childhood, and thoughts about God's faithfulness to water the seeds she plants and help her daughter learn to feel unconditional love and learn to love herself. Client made progress stabilizing and finding relief in the knowledge of the truth that it is not her who is responsible for keeping her safe from all pain in this world. Client became more aware of her need to rely on her God to support both her and her daughter.  Therapist assessed for stability, and client denied SI/HI/AVH.  Interventions: Motivational Interviewing and BSP & CBT   Diagnosis:   ICD-10-CM   1. Obsessional thoughts  F42.8   2. Generalized anxiety disorder  F41.1     Plan of Care:  Client to return for weekly therapy with Zoila Shutter, therapist, to review again in 6 months.  Client to engage in positive self talk and challenging negative internal ruminations and self talk causing client to be overly anxious and worried using CBT, on daily practice. Client to engage in mindfulness: ie body scans each eveneing to help process and discharge emotional distress & recognize emotions. Client to utilize BSP (brainspotting) with therapist to help client regulate their anxiety in a somatic- felt body sense way: (ie by working to reduce muscle tension, ruminations, increased heart rate, constant worrying and feeling "hyper" ) by decreasing anxiety by 33% in the next 6 months.  Client to understand how to succeed in fighting back worry and embrace life's uncertainty. Client to prioritize sleep 8+ hours each week night AEB going to bed by 10pm each night.  Client participated in the treatment planning of their therapy.  Client agreed with the plan if there is a crisis: contact after hours office line, call 9-1-1 and/or crisis line given by therapist.  [name], LCSW, LCAS, CCTP, CCS-I, BSP

## 2020-06-22 NOTE — Telephone Encounter (Signed)
New start for Wellbutrin

## 2020-06-27 ENCOUNTER — Encounter: Payer: Self-pay | Admitting: Behavioral Health

## 2020-06-27 ENCOUNTER — Other Ambulatory Visit: Payer: Self-pay

## 2020-06-27 ENCOUNTER — Ambulatory Visit (INDEPENDENT_AMBULATORY_CARE_PROVIDER_SITE_OTHER): Payer: BC Managed Care – PPO | Admitting: Addiction (Substance Use Disorder)

## 2020-06-27 ENCOUNTER — Ambulatory Visit: Payer: BC Managed Care – PPO | Admitting: Behavioral Health

## 2020-06-27 VITALS — Wt 162.4 lb

## 2020-06-27 DIAGNOSIS — F411 Generalized anxiety disorder: Secondary | ICD-10-CM

## 2020-06-27 DIAGNOSIS — F331 Major depressive disorder, recurrent, moderate: Secondary | ICD-10-CM

## 2020-06-27 MED ORDER — FLUOXETINE HCL 20 MG PO CAPS: 60.0000 mg | ORAL_CAPSULE | Freq: Every day | ORAL | 3 refills | Status: DC

## 2020-06-27 MED ORDER — BUPROPION HCL ER (XL) 300 MG PO TB24: 300.0000 mg | ORAL_TABLET | Freq: Every day | ORAL | 3 refills | Status: DC

## 2020-06-27 NOTE — Progress Notes (Signed)
      [facility] Counselor/Therapist Progress Note  Patient ID: SUHANI STILLION, MRN: [id number],    Date: 06/27/2020  Time Spent:  Treatment Type: Individual Therapy  Reported Symptoms:  Anxious, upset  Mental Status Exam:  Appearance:   Casual and Well Groomed     Behavior:  Appropriate and Sharing  Motor:  Normal  Speech/Language:   Clear and Coherent and Normal Rate  Affect:  Appropriate, Congruent and Tearful  Mood:  anxious and irritable  Thought process:  normal  Thought content:    Obsessions and Rumination  Sensory/Perceptual disturbances:    WNL  Orientation:  x4  Attention:  Good  Concentration:  Good  Memory:  WNL  Fund of knowledge:   Good  Insight:    Good  Judgment:   Good  Impulse Control:  Good   Risk Assessment: Danger to Self:  No Self-injurious Behavior: No Danger to Others: No Duty to Warn:no Physical Aggression / Violence:No  Access to Firearms a concern: No  Gang Involvement:No   Subjective: Client reported her husband considering a new pastor role at a dying church and said shes anxious it will be another desert for her and their spiritual life. Client processed trying to support her husband and not be selfish asking him for only what she needs or think they need, but just a need to express her concern and fears. Thearpist used MI & roleplay to support client in processing her emotions and how to share what she needs with her husband. Therapist encouraged client to work with husband together to decide for their family what they need in this season, using SFT. Therapist assessed for stability, and client denied SI/HI/AVH.  Interventions: Roleplay, Motivational Interviewing and Solution-Oriented/Positive Psychology   Diagnosis:   ICD-10-CM   1. Generalized anxiety disorder  F41.1      Plan of Care:  Client to return for weekly therapy with Zoila Shutter, therapist, to review again in 6 months.  Client to engage in positive self talk and  challenging negative internal ruminations and self talk causing client to be overly anxious and worried using CBT, on daily practice. Client to engage in mindfulness: ie body scans each eveneing to help process and discharge emotional distress & recognize emotions. Client to utilize BSP (brainspotting) with therapist to help client regulate their anxiety in a somatic- felt body sense way: (ie by working to reduce muscle tension, ruminations, increased heart rate, constant worrying and feeling "hyper" ) by decreasing anxiety by 33% in the next 6 months.  Client to understand how to succeed in fighting back worry and embrace life's uncertainty. Client to prioritize sleep 8+ hours each week night AEB going to bed by 10pm each night.  Client participated in the treatment planning of their therapy.  Client agreed with the plan if there is a crisis: contact after hours office line, call 9-1-1 and/or crisis line given by therapist.  [name], LCSW, LCAS, CCTP, CCS-I, BSP
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
## 2020-07-04 ENCOUNTER — Other Ambulatory Visit: Payer: Self-pay

## 2020-07-04 ENCOUNTER — Ambulatory Visit (INDEPENDENT_AMBULATORY_CARE_PROVIDER_SITE_OTHER): Payer: BC Managed Care – PPO | Admitting: Addiction (Substance Use Disorder)

## 2020-07-04 DIAGNOSIS — F428 Other obsessive-compulsive disorder: Secondary | ICD-10-CM

## 2020-07-04 DIAGNOSIS — F411 Generalized anxiety disorder: Secondary | ICD-10-CM | POA: Diagnosis not present
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
## 2020-07-11 ENCOUNTER — Other Ambulatory Visit: Payer: Self-pay

## 2020-07-11 ENCOUNTER — Telehealth: Payer: Self-pay | Admitting: Behavioral Health

## 2020-07-11 ENCOUNTER — Ambulatory Visit (INDEPENDENT_AMBULATORY_CARE_PROVIDER_SITE_OTHER): Payer: BC Managed Care – PPO | Admitting: Addiction (Substance Use Disorder)

## 2020-07-11 DIAGNOSIS — F411 Generalized anxiety disorder: Secondary | ICD-10-CM

## 2020-07-11 DIAGNOSIS — F428 Other obsessive-compulsive disorder: Secondary | ICD-10-CM

## 2020-07-11 NOTE — Telephone Encounter (Signed)
Pt called and said that she is experiencing the sexual side effects that were discussed in her last visit, due to the increased dose of Prozac, Wellbutrin. Pt would like a call back at [phone].
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
## 2020-07-11 NOTE — Telephone Encounter (Signed)
I attempted to call and left voicemail for return call tomorrow morning. I requested her call and leave good time for call back tomorrow.

## 2020-07-11 NOTE — Telephone Encounter (Signed)
Please review

## 2020-07-19 ENCOUNTER — Other Ambulatory Visit: Payer: Self-pay | Admitting: Behavioral Health

## 2020-07-19 ENCOUNTER — Other Ambulatory Visit: Payer: Self-pay

## 2020-07-19 ENCOUNTER — Ambulatory Visit: Payer: BC Managed Care – PPO | Admitting: Addiction (Substance Use Disorder)

## 2020-07-19 DIAGNOSIS — F411 Generalized anxiety disorder: Secondary | ICD-10-CM

## 2020-07-19 DIAGNOSIS — F331 Major depressive disorder, recurrent, moderate: Secondary | ICD-10-CM
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
## 2020-07-25 NOTE — Telephone Encounter (Signed)
Please review

## 2020-07-26 DIAGNOSIS — Z9189 Other specified personal risk factors, not elsewhere classified: Secondary | ICD-10-CM | POA: Diagnosis not present

## 2020-07-26 DIAGNOSIS — Z1239 Encounter for other screening for malignant neoplasm of breast: Secondary | ICD-10-CM | POA: Diagnosis not present

## 2020-07-26 DIAGNOSIS — R922 Inconclusive mammogram: Secondary | ICD-10-CM | POA: Diagnosis not present

## 2020-07-26 DIAGNOSIS — Z803 Family history of malignant neoplasm of breast: Secondary | ICD-10-CM | POA: Diagnosis not present

## 2020-08-08 ENCOUNTER — Ambulatory Visit: Payer: BC Managed Care – PPO | Admitting: Behavioral Health

## 2020-08-08 ENCOUNTER — Other Ambulatory Visit: Payer: Self-pay

## 2020-08-08 ENCOUNTER — Encounter: Payer: Self-pay | Admitting: *Deleted

## 2020-08-08 ENCOUNTER — Encounter: Payer: Self-pay | Admitting: Behavioral Health

## 2020-08-08 VITALS — Wt 164.0 lb

## 2020-08-08 DIAGNOSIS — F3341 Major depressive disorder, recurrent, in partial remission: Secondary | ICD-10-CM | POA: Diagnosis not present

## 2020-08-08 DIAGNOSIS — F411 Generalized anxiety disorder: Secondary | ICD-10-CM | POA: Diagnosis not present

## 2020-08-08 DIAGNOSIS — F428 Other obsessive-compulsive disorder: Secondary | ICD-10-CM

## 2020-08-08 NOTE — Progress Notes (Signed)
[facility] Med Check  Patient ID: Sharon [name],  MRN: [id number]  PCP: Sheliah Hatch, MD  Date of Evaluation: 08/08/2020 Time spent:30 minutes  Chief Complaint:  Chief Complaint   Anxiety; Depression; Follow-up     HISTORY/CURRENT STATUS: HPI 40 year old female presents to this office for follow up and medication management. She says that she is doing pretty good overall with the combination of Wellbutrin 300 mg XL and the Prozac 40 mg. However she did attempt to increase the Prozac as prescribed to 60 mg but experienced undesirable sexual side effects that she does not want to compromise at this time. She self titrated the dose back down to 40 mg and sexual function returned to normal. She says that she continues to have periods where she feels completely overwhelmed at work and then dividing time for family. She is experiencing some breakthrough anxiety and depression at times but manageable level for now. She reports anxiety at 4 today and depression at 2. Is sleeping 6-7 hours per night. She admits that her work ethic drives her to working to much sometimes and pushing herself a little too much. She still tries to maintain weight and run frequently. She expresses that her upcoming vacation will be beneficial for her moods and well being. She would like to entertain a medication change at end of summer. No mania, no psychosis. No SI/HI.   Past mediation trials: Zoloft Trintellix Lexapro  Individual Medical History/ Review of Systems: Changes? :No   Allergies: Patient has no known allergies.  Current Medications:  Current Outpatient Medications:    buPROPion (WELLBUTRIN XL) 300 MG 24 hr tablet, TAKE 1 TABLET BY MOUTH EVERY DAY, Disp: 90 tablet, Rfl: 2   FLUoxetine (PROZAC) 20 MG capsule, TAKE 3 CAPSULES BY MOUTH EVERY DAY, Disp: 270 capsule, Rfl: 2   Vitamin D, Ergocalciferol, (DRISDOL) 1.25 MG (50000 UNIT) CAPS capsule, Take 1 capsule (50,000 Units total) by mouth every  7 (seven) days., Disp: 12 capsule, Rfl: 0 Medication Side Effects: none  Family Medical/ Social History: Changes? No  MENTAL HEALTH EXAM:  There were no vitals taken for this visit.There is no height or weight on file to calculate BMI.  General Appearance: Casual and Neat  Eye Contact:  Good  Speech:  Clear and Coherent  Volume:  Normal  Mood:  Anxious  Affect:  Appropriate  Thought Process:  Coherent  Orientation:  Full (Time, Place, and Person)  Thought Content: Logical   Suicidal Thoughts:  No  Homicidal Thoughts:  No  Memory:  WNL  Judgement:  Good  Insight:  Good  Psychomotor Activity:  Increased  Concentration:  Concentration: Fair  Recall:  Good  Fund of Knowledge: Good  Language: Good  Assets:  Desire for Improvement  ADL's:  Intact  Cognition: WNL  Prognosis:  Good    DIAGNOSES:    ICD-10-CM   1. Generalized anxiety disorder  F41.1     2. Recurrent major depressive disorder, in partial remission (HCC)  F33.41     3. Obsessional thoughts  F42.8       Receiving Psychotherapy: Yes Zoila Shutter   RECOMMENDATIONS:  Patient self titrated back down from Prozac 60 mg to 40 mg due to sexual side effects. Continue Wellbutrin XL to 300 mg daily Will report any unusual side effects promptly To continue in Psychotherapy on regular basis To F/U in 2 months to reassess progress. Greater than 50% of 30 min. face to face time with patient was spent  on counseling and coordination of care. We discussed changes/stressors in patients life that are contributing to stress, anxiety and depression. We discussed switching antidepressants due to the fact that Prozac may not be help as much as it used to and patient not tolerating higher dose due to sexual side effects. She wanted to try another medication at a time when her life is not so busy. Will revisit next appt. No medication adjustments at this time.  Joan Flores, NP

## 2020-08-15 ENCOUNTER — Other Ambulatory Visit: Payer: Self-pay

## 2020-08-15 ENCOUNTER — Ambulatory Visit: Payer: BC Managed Care – PPO | Admitting: Addiction (Substance Use Disorder)

## 2020-08-15 DIAGNOSIS — F411 Generalized anxiety disorder: Secondary | ICD-10-CM | POA: Diagnosis not present

## 2020-08-15 NOTE — Progress Notes (Signed)
      [facility] Counselor/Therapist Progress Note  Patient ID: Sharon [name], MRN: [id number],    Date: 08/15/2020  Time Spent:  Treatment Type: Individual Therapy  Reported Symptoms:  frustrated, scared  Mental Status Exam:  Appearance:   Casual and Well Groomed     Behavior:  Appropriate and Sharing  Motor:  Normal  Speech/Language:   Clear and Coherent and Normal Rate  Affect:  Appropriate and Congruent  Mood:  anxious and irritable  Thought process:  normal  Thought content:    Rumination  Sensory/Perceptual disturbances:    WNL  Orientation:  x4  Attention:  Good  Concentration:  Good  Memory:  WNL  Fund of knowledge:   Good  Insight:    Good  Judgment:   Good  Impulse Control:  Good   Risk Assessment: Danger to Self:  No Self-injurious Behavior: No Danger to Others: No Duty to Warn:no Physical Aggression / Violence:No  Access to Firearms a concern: No  Gang Involvement:No   Subjective: Client reported having 2 items to discuss, but wanting to wait until next time to process one. Client processed today: the safety of running for her, but being afraid of losing the ability of it. Client not giving 100% athletically while running in fear that she will hurt herself,making it impossible to keep running. Client reported starting to run when her dad passed away. Client feels like when she is running, nothing can get to her. Therapist used MI & BSP with client to help her process. Client identified safety in her chest and wanting to grow that using a resource spot; client also processed the fear of defeat with running and found relief (reduced SUDs of 6/10 points) after double spotting with one eye. Therapist assessed for stability, and client denied SI/HI/AVH.  Interventions: Motivational Interviewing, Solution-Oriented/Positive Psychology, and BSP    Diagnosis:   ICD-10-CM   1. Generalized anxiety disorder  F41.1       Plan of Care:  Client to return for  weekly therapy with Zoila Shutter, therapist, to review again in 6 months.  Client to engage in positive self talk and challenging negative internal ruminations and self talk causing client to be overly anxious and worried using CBT, on daily practice. Client to engage in mindfulness: ie body scans each eveneing to help process and discharge emotional distress & recognize emotions. Client to utilize BSP (brainspotting) with therapist to help client regulate their anxiety in a somatic- felt body sense way: (ie by working to reduce muscle tension, ruminations, increased heart rate, constant worrying and feeling "hyper" ) by decreasing anxiety by 33% in the next 6 months.  Client to understand how to succeed in fighting back worry and embrace life's uncertainty. Client to prioritize sleep 8+ hours each week night AEB going to bed by 10pm each night.  Client participated in the treatment planning of their therapy.  Client agreed with the plan if there is a crisis: contact after hours office line, call 9-1-1 and/or crisis line given by therapist.  [name], LCSW, LCAS, CCTP, CCS-I, BSP

## 2020-08-23 ENCOUNTER — Ambulatory Visit: Payer: BC Managed Care – PPO | Admitting: Addiction (Substance Use Disorder)

## 2020-08-29 ENCOUNTER — Other Ambulatory Visit: Payer: Self-pay

## 2020-08-29 ENCOUNTER — Ambulatory Visit (INDEPENDENT_AMBULATORY_CARE_PROVIDER_SITE_OTHER): Payer: BC Managed Care – PPO | Admitting: Addiction (Substance Use Disorder)

## 2020-08-29 DIAGNOSIS — F411 Generalized anxiety disorder: Secondary | ICD-10-CM

## 2020-08-29 NOTE — Progress Notes (Signed)
      [facility] Counselor/Therapist Progress Note  Patient ID: Sharon [name], MRN: [id number],    Date: 08/29/2020  Time Spent:  Treatment Type: Individual Therapy  Reported Symptoms: busy, stressed/ tired, feels suffocated  Mental Status Exam:  Appearance:   Casual and Well Groomed     Behavior:  Appropriate and Sharing  Motor:  Normal  Speech/Language:   Clear and Coherent and Normal Rate  Affect:  Appropriate and Congruent  Mood:  normal and stressed  Thought process:  normal  Thought content:    Rumination  Sensory/Perceptual disturbances:    WNL  Orientation:  x4  Attention:  Good  Concentration:  Good  Memory:  WNL  Fund of knowledge:   Good  Insight:    Good  Judgment:   Good  Impulse Control:  Good   Risk Assessment: Danger to Self:  No Self-injurious Behavior: No Danger to Others: No Duty to Warn:no Physical Aggression / Violence:No  Access to Firearms a concern: No  Gang Involvement:No   Subjective: Client reported being really overwhelmed by the amount that her kids need her. Client reported they both want quality time and physical touch from her and she had a training for bsping concussions and sports injuries and had her sister to visit her in town. Client processed other days this week she has felt suffocated by her client and asked to process the physical feelings about this that overwhelmed her. Therapist used Mindfulness and Bsp to help client process the body-felt-sense of a boa constrictor squeezing her. Client made progress and reported reduced SUDs and more compassion and curiosity for her client and also her kids, while supporting herself. Therapist assessed for stability, and client denied SI/HI/AVH.  Interventions: Mindfulness Meditation, Motivational Interviewing, and BSP    Diagnosis:   ICD-10-CM   1. Generalized anxiety disorder  F41.1      Plan of Care:  Client to return for weekly therapy with Zoila Shutter, therapist, to review  again in 6 months.  Client to engage in positive self talk and challenging negative internal ruminations and self talk causing client to be overly anxious and worried using CBT, on daily practice. Client to engage in mindfulness: ie body scans each eveneing to help process and discharge emotional distress & recognize emotions. Client to utilize BSP (brainspotting) with therapist to help client regulate their anxiety in a somatic- felt body sense way: (ie by working to reduce muscle tension, ruminations, increased heart rate, constant worrying and feeling "hyper" ) by decreasing anxiety by 33% in the next 6 months.  Client to understand how to succeed in fighting back worry and embrace life's uncertainty. Client to prioritize sleep 8+ hours each week night AEB going to bed by 10pm each night.  Client participated in the treatment planning of their therapy.  Client agreed with the plan if there is a crisis: contact after hours office line, call 9-1-1 and/or crisis line given by therapist.  [name], LCSW, LCAS, CCTP, CCS-I, BSP

## 2020-09-06 ENCOUNTER — Other Ambulatory Visit: Payer: Self-pay

## 2020-09-06 ENCOUNTER — Ambulatory Visit: Payer: BC Managed Care – PPO | Admitting: Addiction (Substance Use Disorder)

## 2020-09-06 DIAGNOSIS — F411 Generalized anxiety disorder: Secondary | ICD-10-CM | POA: Diagnosis not present

## 2020-09-06 NOTE — Progress Notes (Signed)
      [facility] Counselor/Therapist Progress Note  Patient ID: Sharon [name], MRN: [id number],    Date: 09/06/2020  Time Spent:  Treatment Type: Individual Therapy  Reported Symptoms: frustrated and stressed  Mental Status Exam:  Appearance:   Casual and Well Groomed     Behavior:  Appropriate and Sharing  Motor:  Normal  Speech/Language:   Clear and Coherent and Normal Rate  Affect:  Appropriate and Congruent  Mood:  normal and stressed  Thought process:  normal  Thought content:    Rumination  Sensory/Perceptual disturbances:    WNL  Orientation:  x4  Attention:  [name]  Concentration:  [name]  Memory:  WNL  Fund of knowledge:   [name]  Insight:    [name]  Judgment:   [name]  Impulse Control:  [name]   Risk Assessment: Danger to Self:  No Self-injurious Behavior: No Danger to Others: No Duty to Warn:no Physical Aggression / Violence:No  Access to Firearms a concern: No  Gang Involvement:No   Subjective: Client reported feeling frustrated and stressed about issues with her body image and home life that is not life-giving. Client reported being so confused about how she is losing muscle and gaining body fat when she is trainining with a trainer and running weekly. Client discussed her nutrition issues due to not getting enough protein and too much saturated fat. Client preparing for vacation this week and had a lot of client crises come up this week. Client making progress in setting boundaries for clients who are demanding things last minute of her. Therapist used MI & RPT with client to support her as she processes her feelings and validate her, while also helping her consider ways to keep moving towards her goals. Therapist assessed for stability, and client denied SI/HI/AVH.  Interventions: Motivational Interviewing and RPT    Diagnosis:   ICD-10-CM   1. Generalized anxiety disorder  F41.1       Plan of Care:  Client to return for weekly therapy with Sharon [name],  therapist, to review again in 6 months.  Client to engage in positive self talk and challenging negative internal ruminations and self talk causing client to be overly anxious and worried using CBT, on daily practice. Client to engage in mindfulness: ie body scans each eveneing to help process and discharge emotional distress & recognize emotions. Client to utilize BSP (brainspotting) with therapist to help client regulate their anxiety in a somatic- felt body sense way: (ie by working to reduce muscle tension, ruminations, increased heart rate, constant worrying and feeling "hyper" ) by decreasing anxiety by 33% in the next 6 months.  Client to understand how to succeed in fighting back worry and embrace life's uncertainty. Client to prioritize sleep 8+ hours each week night AEB going to bed by 10pm each night.  Client participated in the treatment planning of their therapy.  Client agreed with the plan if there is a crisis: contact after hours office line, call 9-1-1 and/or crisis line given by therapist.  Sharon Good, LCSW, LCAS, CCTP, CCS-I, BSP

## 2020-09-19 ENCOUNTER — Ambulatory Visit: Payer: BC Managed Care – PPO | Admitting: Addiction (Substance Use Disorder)

## 2020-09-20 ENCOUNTER — Other Ambulatory Visit: Payer: Self-pay

## 2020-09-20 ENCOUNTER — Ambulatory Visit (INDEPENDENT_AMBULATORY_CARE_PROVIDER_SITE_OTHER): Payer: BC Managed Care – PPO | Admitting: Addiction (Substance Use Disorder)

## 2020-09-20 DIAGNOSIS — F39 Unspecified mood [affective] disorder: Secondary | ICD-10-CM

## 2020-09-20 DIAGNOSIS — F411 Generalized anxiety disorder: Secondary | ICD-10-CM

## 2020-09-20 NOTE — Progress Notes (Signed)
      [facility] Counselor/Therapist Progress Note  Patient ID: Sharon [name], MRN: [id number],    Date: 09/20/2020  Time Spent:  Treatment Type: Individual Therapy  Reported Symptoms: frustrated and hurt  Mental Status Exam:  Appearance:   Casual and Well Groomed     Behavior:  Appropriate and Sharing  Motor:  Normal  Speech/Language:   Clear and Coherent and Normal Rate  Affect:  Appropriate and Congruent  Mood:  anxious, sad, and stressed  Thought process:  normal  Thought content:    Rumination  Sensory/Perceptual disturbances:    WNL  Orientation:  x4  Attention:  Good  Concentration:  Good  Memory:  WNL  Fund of knowledge:   Good  Insight:    Good  Judgment:   Good  Impulse Control:  Good   Risk Assessment: Danger to Self:  No Self-injurious Behavior: No Danger to Others: No Duty to Warn:no Physical Aggression / Violence:No  Access to Firearms a concern: No  Gang Involvement:No   Subjective: Client reported feeling so alone and hurt by her husband and his family. She dreaded the family vacation to the beach and her fears were "confirmed". Client extremely tearful about her husband's hurtful words to "grab her bags and go stay at a hotel if shes going to drink 'this much'." Client processed the story and her lack of drinking more than 3 drinks over a few hours & not feeling drunk at all. Therapist used MI & CBT with client to validate her pain and help her process her feelings and thoughts about the situation. Client also reported that her husband expects her to jump in and help and he wont tell her and she thinks this is why he got angry with her. Client upset that he gets angry with her and feels helpless. Therapist used MI & grief therapy to validate client's frustration and hurt feelings and her thoughts about how to try to fix those things with her husband and her husband's family. Client motivated to discuss this in therapy with her husband. Therapist  assessed for stability, and client denied SI/HI/AVH.  Interventions: Cognitive Behavioral Therapy, Motivational Interviewing, Grief Therapy, Family Systems, and RPT    Diagnosis:   ICD-10-CM   1. Generalized anxiety disorder  F41.1       Plan of Care:  Client to return for weekly therapy with Zoila Shutter, therapist, to review again in 6 months.  Client to engage in positive self talk and challenging negative internal ruminations and self talk causing client to be overly anxious and worried using CBT, on daily practice. Client to engage in mindfulness: ie body scans each eveneing to help process and discharge emotional distress & recognize emotions. Client to utilize BSP (brainspotting) with therapist to help client regulate their anxiety in a somatic- felt body sense way: (ie by working to reduce muscle tension, ruminations, increased heart rate, constant worrying and feeling "hyper" ) by decreasing anxiety by 33% in the next 6 months.  Client to understand how to succeed in fighting back worry and embrace life's uncertainty. Client to prioritize sleep 8+ hours each week night AEB going to bed by 10pm each night.  Client participated in the treatment planning of their therapy.  Client agreed with the plan if there is a crisis: contact after hours office line, call 9-1-1 and/or crisis line given by therapist.  [name], LCSW, LCAS, CCTP, CCS-I, BSP

## 2020-09-26 ENCOUNTER — Ambulatory Visit (INDEPENDENT_AMBULATORY_CARE_PROVIDER_SITE_OTHER): Payer: BC Managed Care – PPO | Admitting: Addiction (Substance Use Disorder)

## 2020-09-26 DIAGNOSIS — F411 Generalized anxiety disorder: Secondary | ICD-10-CM

## 2020-09-26 NOTE — Progress Notes (Signed)
[facility] Counselor/Therapist Progress Note  Patient ID: Sharon [name], MRN: [id number],    Date: 09/26/2020  Time Spent:  Treatment Type: Individual Therapy  Reported Symptoms: upset, discouraged  Mental Status Exam:  Appearance:   Casual     Behavior:  Appropriate and Sharing  Motor:  Normal  Speech/Language:   Clear and Coherent and Normal Rate  Affect:  Appropriate and Congruent  Mood:  anxious, irritable, and labile  Thought process:  circumstantial and flight of ideas  Thought content:    Obsessions and Rumination  Sensory/Perceptual disturbances:    WNL  Orientation:  x4  Attention:  Good  Concentration:  Good  Memory:  WNL  Fund of knowledge:   Good  Insight:    Good  Judgment:   Good  Impulse Control:  Good   Risk Assessment: Danger to Self:  No Self-injurious Behavior: No Danger to Others: No Duty to Warn:no Physical Aggression / Violence:No  Access to Firearms a concern: No  Gang Involvement:No    Virtual Visit via VIDEO: I connected with client by MyChart video enabled telemedicine/telehealth application, with their informed consent, and verified client privacy and that I am speaking with the correct person using two identifiers. I discussed the limitations, risks, security and privacy concerns of performing psychotherapy and management service virtually and confirmed their location. I also discussed with the patient that there may be a patient responsible charge related to this service and to confirm with the front desk if their insurance covers teletherapy. I also discussed with the patient the availability of in person appointments. The patient expressed understanding and agreed to proceed. I discussed the treatment planning with the client. The client was provided an opportunity to ask questions and all were answered. The client agreed with the plan and demonstrated an understanding of the instructions. The client was advised to call our  office if symptoms worsen or feel they are in a crisis state and need immediate contact. Client also reminded of a crisis line number and to use 9-1-1 if there's an emergency.  Therapist Location: office; Client Location: home.   Subjective: Client reported feeling discouraged and upset after waiting to see a viewing of a house with her real estate agent and being let down. Client tearful about the let down and sadness she felt. Client reported being really hopeful about her interest in a home both her and her husband absolutely loved. Client reported feeling "wrecked and so heavy" and is inconsolable due to the grief they are feeling with their offer being declined. Client unsure of what God meant when she felt he said they would get the house. Therapist used MI & Grief therapy & CBT to validate her grief, and provide empathy and help her process her emotions and confused thoughts.  Therapist assessed for stability, and client denied SI/HI/AVH.  Interventions: Cognitive Behavioral Therapy, Motivational Interviewing, Grief Therapy, and RPT    Diagnosis:   ICD-10-CM   1. Generalized anxiety disorder  F41.1       Plan of Care:  Client to return for weekly therapy with Zoila Shutter, therapist, to review again in 6 months.  Client to engage in positive self talk and challenging negative internal ruminations and self talk causing client to be overly anxious and worried using CBT, on daily practice. Client to engage in mindfulness: ie body scans each eveneing to help process and discharge emotional distress & recognize emotions. Client to utilize BSP (brainspotting) with therapist to  help client regulate their anxiety in a somatic- felt body sense way: (ie by working to reduce muscle tension, ruminations, increased heart rate, constant worrying and feeling "hyper" ) by decreasing anxiety by 33% in the next 6 months.  Client to understand how to succeed in fighting back worry and embrace life's  uncertainty. Client to prioritize sleep 8+ hours each week night AEB going to bed by 10pm each night.  Client participated in the treatment planning of their therapy.  Client agreed with the plan if there is a crisis: contact after hours office line, call 9-1-1 and/or crisis line given by therapist.  [name], LCSW, LCAS, CCTP, CCS-I, BSP

## 2020-10-04 ENCOUNTER — Ambulatory Visit: Payer: BC Managed Care – PPO | Admitting: Behavioral Health

## 2020-10-18 ENCOUNTER — Ambulatory Visit: Payer: BC Managed Care – PPO | Admitting: Addiction (Substance Use Disorder)

## 2020-10-18 ENCOUNTER — Other Ambulatory Visit: Payer: Self-pay

## 2020-10-18 DIAGNOSIS — F39 Unspecified mood [affective] disorder: Secondary | ICD-10-CM | POA: Diagnosis not present

## 2020-10-18 DIAGNOSIS — F428 Other obsessive-compulsive disorder: Secondary | ICD-10-CM | POA: Diagnosis not present

## 2020-10-18 NOTE — Progress Notes (Signed)
[facility] Counselor/Therapist Progress Note  Patient ID: Sharon [name], MRN: [id number],    Date: 10/18/2020  Time Spent:  Treatment Type: Individual Therapy  Reported Symptoms: hopeless, grieved, pressured to be the regulated adult  Mental Status Exam:  Appearance:   Casual     Behavior:  Appropriate and Sharing  Motor:  Normal  Speech/Language:   Clear and Coherent and Normal Rate  Affect:  Congruent, Labile, and Tearful  Mood:  anxious, irritable, labile, and sad  Thought process:  circumstantial and flight of ideas  Thought content:    Obsessions and Rumination  Sensory/Perceptual disturbances:    WNL  Orientation:  x4  Attention:  Good  Concentration:  Good  Memory:  WNL  Fund of knowledge:   Good  Insight:    Good  Judgment:   Good  Impulse Control:  Good   Risk Assessment: Danger to Self:  No Self-injurious Behavior: No Danger to Others: No Duty to Warn:no Physical Aggression / Violence:No  Access to Firearms a concern: No  Gang Involvement:No    Subjective: Client reported feeling hopeless with all the weight of others emotions that she feels obligated to regulate. Client expressed her sadness to watch her loved ones and her client wrestle most with the goodness of God and feeling forgotten about by him. Client desires for others to know Him and feel Him like she does throughout the trials. Client expressed about her faith's development over the years and her prayers for others to have their own journey with this also. Client tearfully processed the pain and grief of watching others struggle to find their will to live. Client feels pressure to be the regulated adult/parent for all of them (her sisters & clients included) and therapist used MI & unconditional positive regard with client to help her feel the feelings of others without the need to try to fix them. Therapist used mindfulness with client to help her identify and feel her feelings about all  of this and  inquired about client's ability to not take on a parental role to her detriment and consider ways she can tend to her own needs and not ignore them to tend to others'. Therapist assessed for stability, and client denied SI/HI/AVH.  Interventions: Mindfulness Meditation, Motivational Interviewing, and Grief Therapy   Diagnosis:   ICD-10-CM   1. Episodic mood disorder (HCC)  F39     2. Obsessional thoughts  F42.8      Plan of Care:  Client to return for weekly therapy with Zoila Shutter, therapist, to review again in 6 months.  Client to engage in positive self talk and challenging negative internal ruminations and self talk causing client to be overly anxious and worried using CBT, on daily practice. Client to engage in mindfulness: ie body scans each eveneing to help process and discharge emotional distress & recognize emotions. Client to utilize BSP (brainspotting) with therapist to help client regulate their anxiety in a somatic- felt body sense way: (ie by working to reduce muscle tension, ruminations, increased heart rate, constant worrying and feeling "hyper" ) by decreasing anxiety by 33% in the next 6 months.  Client to understand how to succeed in fighting back worry and embrace life's uncertainty. Client to prioritize sleep 8+ hours each week night AEB going to bed by 10pm each night.  Client participated in the treatment planning of their therapy.  Client agreed with the plan if there is a crisis: contact after hours office  line, call 9-1-1 and/or crisis line given by therapist.  [name], LCSW, LCAS, CCTP, CCS-I, BSP

## 2020-10-31 ENCOUNTER — Ambulatory Visit: Payer: BC Managed Care – PPO | Admitting: Addiction (Substance Use Disorder)

## 2020-10-31 ENCOUNTER — Other Ambulatory Visit: Payer: Self-pay

## 2020-10-31 DIAGNOSIS — F39 Unspecified mood [affective] disorder: Secondary | ICD-10-CM

## 2020-11-01 NOTE — Progress Notes (Signed)
      [facility] Counselor/Therapist Progress Note  Patient ID: HALO SHEVLIN, MRN: [id number],    Date: 11/01/2020  Time Spent:  Treatment Type: Individual Therapy  Reported Symptoms: heavy but hopeful  Mental Status Exam:  Appearance:   Casual     Behavior:  Appropriate and Sharing  Motor:  Normal  Speech/Language:   Clear and Coherent and Normal Rate  Affect:  Appropriate, Congruent, and Tearful  Mood:  anxious and sad  Thought process:  circumstantial and flight of ideas  Thought content:    Obsessions and Rumination  Sensory/Perceptual disturbances:    WNL  Orientation:  x4  Attention:  Good  Concentration:  Good  Memory:  WNL  Fund of knowledge:   Good  Insight:    Good  Judgment:   Good  Impulse Control:  Good   Risk Assessment: Danger to Self:  No Self-injurious Behavior: No Danger to Others: No Duty to Warn:no Physical Aggression / Violence:No  Access to Firearms a concern: No  Gang Involvement:No   Subjective: Client reported feeling heavy but also hopeful in 2 spots in her body. Therapist used MI, mindfulness and BSP with client to double spot using outside window. Client processed feeling the ongoing weight in her body of the things she cant control and her emotional exhaustion,. Client processed the chaotic journey of finding a home & client feeling hopeful that it finally came to fruition and they accepted their offer on a house in [place].  Client reported progress and a reduction in the heavy SUDs in her chest, along with a strengthening of her hopefulness in her neck. Client looking forward to the new changes and move in their lives. Therapist assessed for stability, and client denied SI/HI/AVH.  Interventions: Mindfulness Meditation, Motivational Interviewing, and BSP    Diagnosis:   ICD-10-CM   1. Episodic mood disorder (HCC)  F39       Plan of Care:  Client to return for weekly therapy with Zoila Shutter, therapist, to review again in 6  months.  Client to engage in positive self talk and challenging negative internal ruminations and self talk causing client to be overly anxious and worried using CBT, on daily practice. Client to engage in mindfulness: ie body scans each eveneing to help process and discharge emotional distress & recognize emotions. Client to utilize BSP (brainspotting) with therapist to help client regulate their anxiety in a somatic- felt body sense way: (ie by working to reduce muscle tension, ruminations, increased heart rate, constant worrying and feeling "hyper" ) by decreasing anxiety by 33% in the next 6 months.  Client to understand how to succeed in fighting back worry and embrace life's uncertainty. Client to prioritize sleep 8+ hours each week night AEB going to bed by 10pm each night.  Client participated in the treatment planning of their therapy.  Client agreed with the plan if there is a crisis: contact after hours office line, call 9-1-1 and/or crisis line given by therapist.  [name], LCSW, LCAS, CCTP, CCS-I, BSP

## 2020-11-07 ENCOUNTER — Other Ambulatory Visit: Payer: Self-pay

## 2020-11-07 ENCOUNTER — Ambulatory Visit (INDEPENDENT_AMBULATORY_CARE_PROVIDER_SITE_OTHER): Payer: BC Managed Care – PPO | Admitting: Addiction (Substance Use Disorder)

## 2020-11-07 DIAGNOSIS — F39 Unspecified mood [affective] disorder: Secondary | ICD-10-CM

## 2020-11-07 NOTE — Progress Notes (Signed)
      [facility] Counselor/Therapist Progress Note  Patient ID: Sharon [name], MRN: [id number],    Date: 11/07/2020  Time Spent:  Treatment Type: Individual Therapy  Reported Symptoms: nervous & excited  Mental Status Exam:  Appearance:   Casual     Behavior:  Appropriate and Sharing  Motor:  Normal  Speech/Language:   Clear and Coherent and Normal Rate  Affect:  Appropriate and Congruent  Mood:  anxious  Thought process:  circumstantial and flight of ideas  Thought content:    Obsessions and Rumination  Sensory/Perceptual disturbances:    WNL  Orientation:  x4  Attention:  Good  Concentration:  Good  Memory:  WNL  Fund of knowledge:   Good  Insight:    Good  Judgment:   Good  Impulse Control:  Good   Risk Assessment: Danger to Self:  No Self-injurious Behavior: No Danger to Others: No Duty to Warn:no Physical Aggression / Violence:No  Access to Firearms a concern: No  Gang Involvement:No   Subjective: Client reported feeling nervous about the big changes coming up like her husband switching jobs, but feeling sure he will find some kind of work soon. Client reported her excitement at getting a house in [place]. Client reported not understanding certain life choices of others and having different convictions that strongly guided her and her husband that they are looking for in his next job as a Education officer, environmental. Client processed her thoughts about his recent interview and feelings about being asked to come to his next one. Therapist and client used MI & roleplay to process things she could share in her heart and thoughts she wouldn't need to. Therapist assessed for stability, and client denied SI/HI/AVH.  Interventions: Cognitive Behavioral Therapy, Roleplay, Motivational Interviewing, and BSP    Diagnosis:   ICD-10-CM   1. Episodic mood disorder (HCC)  F39        Plan of Care:   Client to return for weekly therapy with Zoila Shutter, therapist, to review again in 6 months.  Client to engage in positive self talk and challenging negative internal ruminations and self talk causing client to be overly anxious and worried using CBT, on daily practice. Client to engage in mindfulness: ie body scans each eveneing to help process and discharge emotional distress & recognize emotions. Client to utilize BSP (brainspotting) with therapist to help client regulate their anxiety in a somatic- felt body sense way: (ie by working to reduce muscle tension, ruminations, increased heart rate, constant worrying and feeling "hyper" ) by decreasing anxiety by 33% in the next 6 months.  Client to understand how to succeed in fighting back worry and embrace life's uncertainty. Client to prioritize sleep 8+ hours each week night AEB going to bed by 10pm each night.  Client participated in the treatment planning of their therapy.  Client agreed with the plan if there is a crisis: contact after hours office line, call 9-1-1 and/or crisis line given by therapist.  [name], LCSW, LCAS, CCTP, CCS-I, BSP

## 2020-11-15 ENCOUNTER — Other Ambulatory Visit: Payer: Self-pay

## 2020-11-15 ENCOUNTER — Ambulatory Visit (INDEPENDENT_AMBULATORY_CARE_PROVIDER_SITE_OTHER): Payer: BC Managed Care – PPO | Admitting: Addiction (Substance Use Disorder)

## 2020-11-15 DIAGNOSIS — F39 Unspecified mood [affective] disorder: Secondary | ICD-10-CM | POA: Diagnosis not present

## 2020-11-15 NOTE — Progress Notes (Signed)
      [facility] Counselor/Therapist Progress Note  Patient ID: Sharon [name], MRN: [id number],    Date: 11/15/2020  Time Spent:  Treatment Type: Individual Therapy  Reported Symptoms: fearful and sad  Mental Status Exam:  Appearance:   Casual     Behavior:  Appropriate and Sharing  Motor:  Normal  Speech/Language:   Clear and Coherent and Normal Rate  Affect:  Appropriate and Congruent  Mood:  anxious and sad  Thought process:  circumstantial and flight of ideas  Thought content:    Obsessions and Rumination  Sensory/Perceptual disturbances:    WNL  Orientation:  x4  Attention:  Good  Concentration:  Good  Memory:  WNL  Fund of knowledge:   Good  Insight:    Good  Judgment:   Fair  Impulse Control:  Good   Risk Assessment: Danger to Self:  No Self-injurious Behavior: No Danger to Others: No Duty to Warn:no Physical Aggression / Violence:No  Access to Firearms a concern: No  Gang Involvement:No   Subjective: Client reported processed having trouble feeling happy for them closing on the house, due to not feeling worthy of it. Client reported her family's lack of congratulations that hurts her feelings and makes her not want to share it with them. Therapist used MI & Brainspotting with client to support her in validating her feelings while also processing the feelings of fear and judgement she feels that are clouding her ability to feel joy. Client made progress reducing her SUDS related to the fear and then was able to find more joy and reassurance that it was a gift from God she didn't have to be ashamed of. Therapist assessed for stability, and client denied SI/HI/AVH.  Interventions: Motivational Interviewing and BSP    Diagnosis:   ICD-10-CM   1. Episodic mood disorder (HCC)  F39        Plan of Care:  Client to return for weekly therapy with Zoila Shutter, therapist, to review again in 6 months.  Client to engage in positive self talk and challenging  negative internal ruminations and self talk causing client to be overly anxious and worried using CBT, on daily practice. Client to engage in mindfulness: ie body scans each eveneing to help process and discharge emotional distress & recognize emotions. Client to utilize BSP (brainspotting) with therapist to help client regulate their anxiety in a somatic- felt body sense way: (ie by working to reduce muscle tension, ruminations, increased heart rate, constant worrying and feeling "hyper" ) by decreasing anxiety by 33% in the next 6 months.  Client to understand how to succeed in fighting back worry and embrace life's uncertainty. Client to prioritize sleep 8+ hours each week night AEB going to bed by 10pm each night.  Client participated in the treatment planning of their therapy.  Client agreed with the plan if there is a crisis: contact after hours office line, call 9-1-1 and/or crisis line given by therapist.  [name], LCSW, LCAS, CCTP, CCS-I, BSP

## 2020-11-21 ENCOUNTER — Ambulatory Visit (INDEPENDENT_AMBULATORY_CARE_PROVIDER_SITE_OTHER): Payer: BC Managed Care – PPO | Admitting: Addiction (Substance Use Disorder)

## 2020-11-21 ENCOUNTER — Other Ambulatory Visit: Payer: Self-pay

## 2020-11-21 DIAGNOSIS — F39 Unspecified mood [affective] disorder: Secondary | ICD-10-CM | POA: Diagnosis not present

## 2020-11-21 NOTE — Progress Notes (Signed)
      [facility] Counselor/Therapist Progress Note  Patient ID: Sharon [name], MRN: [id number],    Date: 11/21/2020  Time Spent:  Treatment Type: Individual Therapy  Reported Symptoms: touched, feeling seen  Mental Status Exam:  Appearance:   Casual     Behavior:  Appropriate and Sharing  Motor:  Normal  Speech/Language:   Clear and Coherent and Normal Rate  Affect:  Appropriate and Congruent  Mood:  sad  Thought process:  normal  Thought content:    Rumination  Sensory/Perceptual disturbances:    WNL  Orientation:  x4  Attention:  Good  Concentration:  Good  Memory:  WNL  Fund of knowledge:   Good  Insight:    Good  Judgment:   Good  Impulse Control:  Good   Risk Assessment: Danger to Self:  No Self-injurious Behavior: No Danger to Others: No Duty to Warn:no Physical Aggression / Violence:No  Access to Firearms a concern: No  Gang Involvement:No   Subjective: Client reported feeling overcome with emotion after closing on her house yesterday. Client feeling happy and relieved and feels that it helped to have brainspotted the feeling of being joyful when she tells others her news. Client made progress allowing her in laws to celebrate this with her. Client also reported making progress with feeling comfortable around other family. Client reported her sister ignored her news to be closing on her 1st house and it hurt her, but her younger sister loved on her and showed her that she was seen and loved. Client was touched by this and moved/changed by this "being seen for the first time by her family." Therapist assessed for stability, and client denied SI/HI/AVH.  Interventions: Cognitive Behavioral Therapy and Motivational Interviewing   Diagnosis:   ICD-10-CM   1. Episodic mood disorder (HCC)  F39        Plan of Care:  Client to return for weekly therapy with Zoila Shutter, therapist, to review again in 6 months.  Client to engage in positive self talk and  challenging negative internal ruminations and self talk causing client to be overly anxious and worried using CBT, on daily practice. Client to engage in mindfulness: ie body scans each eveneing to help process and discharge emotional distress & recognize emotions. Client to utilize BSP (brainspotting) with therapist to help client regulate their anxiety in a somatic- felt body sense way: (ie by working to reduce muscle tension, ruminations, increased heart rate, constant worrying and feeling "hyper" ) by decreasing anxiety by 33% in the next 6 months.  Client to understand how to succeed in fighting back worry and embrace life's uncertainty. Client to prioritize sleep 8+ hours each week night AEB going to bed by 10pm each night.  Client participated in the treatment planning of their therapy.  Client agreed with the plan if there is a crisis: contact after hours office line, call 9-1-1 and/or crisis line given by therapist.  [name], LCSW, LCAS, CCTP, CCS-I, BSP

## 2020-12-20 ENCOUNTER — Ambulatory Visit (INDEPENDENT_AMBULATORY_CARE_PROVIDER_SITE_OTHER): Payer: BC Managed Care – PPO | Admitting: Addiction (Substance Use Disorder)

## 2020-12-20 ENCOUNTER — Other Ambulatory Visit: Payer: Self-pay

## 2020-12-20 DIAGNOSIS — F39 Unspecified mood [affective] disorder: Secondary | ICD-10-CM | POA: Diagnosis not present

## 2020-12-20 NOTE — Progress Notes (Signed)
      [facility] Counselor/Therapist Progress Note  Patient ID: Sharon [name], MRN: [id number],    Date: 12/20/2020  Time Spent:  Treatment Type: Individual Therapy  Reported Symptoms: feeling heavy, wounded.  Mental Status Exam:  Appearance:   Casual     Behavior:  Appropriate and Sharing  Motor:  Normal  Speech/Language:   Clear and Coherent and Normal Rate  Affect:  Appropriate, Congruent, and Tearful  Mood:  anxious and sad  Thought process:  normal  Thought content:    Rumination  Sensory/Perceptual disturbances:    WNL  Orientation:  x4  Attention:  Good  Concentration:  Good  Memory:  WNL  Fund of knowledge:   Good  Insight:    Good  Judgment:   Good  Impulse Control:  Good   Risk Assessment: Danger to Self:  No Self-injurious Behavior: No Danger to Others: No Duty to Warn:no Physical Aggression / Violence:No  Access to Firearms a concern: No  Gang Involvement:No   Subjective: Client reported feeling very heavy today and having just moved to a new house [employer], causing her daughter to have to change schools. Client felt her daughter's pain and empathized with it; client upset she didn't have that from her mom growing up. Client commented on what it was like as a child with her mom and the trauma it caused her to have to always be "the golden child" due to her mom's yelling and dysfunction/ critiques. Therapist used MI to validate client's wounds and mindfulness with client to help her identify more of her somatic sensations connected to the grief and help her process it in a less triggering way. Client made progress processing her pain and grieving what she and her sister's have experienced. Client processed more of her inner feelings that she can trust that helps her trust her memory of her childhood ( that her mom invalidated ). Therapist assessed for stability, and client denied SI/HI/AVH.  Interventions: Cognitive Behavioral Therapy, Mindfulness  Meditation, Motivational Interviewing, and Grief Therapy   Diagnosis:   ICD-10-CM   1. Episodic mood disorder (HCC)  F39       Plan of Care:  Client to return for weekly therapy with Zoila Shutter, therapist, to review again in 6 months.  Client to engage in positive self talk and challenging negative internal ruminations and self talk causing client to be overly anxious and worried using CBT, on daily practice. Client to engage in mindfulness: ie body scans each eveneing to help process and discharge emotional distress & recognize emotions. Client to utilize BSP (brainspotting) with therapist to help client regulate their anxiety in a somatic- felt body sense way: (ie by working to reduce muscle tension, ruminations, increased heart rate, constant worrying and feeling "hyper" ) by decreasing anxiety by 33% in the next 6 months.  Client to understand how to succeed in fighting back worry and embrace life's uncertainty. Client to prioritize sleep 8+ hours each week night AEB going to bed by 10pm each night.  Client participated in the treatment planning of their therapy.  Client agreed with the plan if there is a crisis: contact after hours office line, call 9-1-1 and/or crisis line given by therapist.  [name], LCSW, LCAS, CCTP, CCS-I, BSP

## 2020-12-26 ENCOUNTER — Ambulatory Visit (INDEPENDENT_AMBULATORY_CARE_PROVIDER_SITE_OTHER): Payer: BC Managed Care – PPO | Admitting: Addiction (Substance Use Disorder)

## 2020-12-26 ENCOUNTER — Other Ambulatory Visit: Payer: Self-pay

## 2020-12-26 DIAGNOSIS — F39 Unspecified mood [affective] disorder: Secondary | ICD-10-CM | POA: Diagnosis not present

## 2020-12-26 NOTE — Progress Notes (Signed)
      [facility] Counselor/Therapist Progress Note  Patient ID: Sharon [name], MRN: [id number],    Date: 12/26/2020  Time Spent:  Treatment Type: Individual Therapy  Reported Symptoms: afraid   Mental Status Exam:  Appearance:   Casual     Behavior:  Appropriate and Sharing  Motor:  Normal  Speech/Language:   Clear and Coherent and Normal Rate  Affect:  Appropriate, Congruent, and Tearful  Mood:  anxious and sad  Thought process:  normal  Thought content:    Rumination  Sensory/Perceptual disturbances:    WNL  Orientation:  x4  Attention:  Good  Concentration:  Good  Memory:  WNL  Fund of knowledge:   Good  Insight:    Good  Judgment:   Good  Impulse Control:  Good   Risk Assessment: Danger to Self:  No Self-injurious Behavior: No Danger to Others: No Duty to Warn:no Physical Aggression / Violence:No  Access to Firearms a concern: No  Gang Involvement:No   Subjective: Client reported wanting to make room inside herself for her own self care and how to help her be a more present mom. Client feels like her children are always needing more from her than she can offer them each day. 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 Client aware of her need to re-parent her clients within reason but needing more strict boundaries in her head to stop her intrusive thoughts about her clients throughout the day. Client very compassionate about her clients and kids and feeling responsible for "making them feel loved". Therapist used MI, CBT & BSP with client to help her process through this feeling of responsibility until she felt the weight of that lift. Client made progress and no longer felt crippled with fear of leaving her kids without them knowing she loves them. Client was able to recite areas that she heard her kids say something that told her they knew how much they are loved.  Therapist assessed for stability, and client denied SI/HI/AVH.  Interventions: Cognitive Behavioral Therapy, Mindfulness Meditation, Motivational Interviewing, and BSP    Diagnosis:   ICD-10-CM   1. Episodic mood disorder (HCC)  F39       Plan of Care:  Client to return for weekly therapy with Zoila Shutter, therapist, to review again in 6 months.  Client to engage in positive self talk and challenging negative internal ruminations and self talk causing client to be overly anxious and worried using CBT, on daily practice. Client to engage in mindfulness: ie body scans each eveneing to help process and discharge emotional distress & recognize emotions. Client to utilize BSP (brainspotting) with therapist to help client regulate their anxiety in a somatic- felt body sense way: (ie by working to reduce muscle tension, ruminations, increased heart rate, constant worrying and feeling "hyper" ) by decreasing anxiety by 33% in the next 6 months.  Client to understand how to succeed in fighting back worry and embrace life's uncertainty. Client to prioritize sleep 8+ hours each week night AEB going to bed by 10pm each night.  Client participated in the treatment planning of their therapy.  Client agreed with the plan if there is a crisis: contact after hours office line, call 9-1-1 and/or crisis line given by therapist.  [name], LCSW, LCAS, CCTP, CCS-I, BSP

## 2021-01-16 ENCOUNTER — Ambulatory Visit (INDEPENDENT_AMBULATORY_CARE_PROVIDER_SITE_OTHER): Payer: BC Managed Care – PPO | Admitting: Addiction (Substance Use Disorder)

## 2021-01-16 ENCOUNTER — Other Ambulatory Visit: Payer: Self-pay

## 2021-01-16 DIAGNOSIS — F39 Unspecified mood [affective] disorder: Secondary | ICD-10-CM | POA: Diagnosis not present

## 2021-01-17 NOTE — Progress Notes (Signed)
      [facility] Counselor/Therapist Progress Note  Patient ID: DELONA CLASBY, MRN: [id number],    Date: 01/17/2021  Time Spent:  Treatment Type: Individual Therapy  Reported Symptoms: nervous but ready for change   Mental Status Exam:  Appearance:   Casual     Behavior:  Appropriate and Sharing  Motor:  Normal  Speech/Language:   Clear and Coherent and Normal Rate  Affect:  Appropriate and Congruent  Mood:  anxious  Thought process:  normal  Thought content:    Rumination  Sensory/Perceptual disturbances:    WNL  Orientation:  x4  Attention:  Good  Concentration:  Good  Memory:  WNL  Fund of knowledge:   Good  Insight:    Good  Judgment:   Good  Impulse Control:  Good   Risk Assessment: Danger to Self:  No Self-injurious Behavior: No Danger to Others: No Duty to Warn:no Physical Aggression / Violence:No  Access to Firearms a concern: No  Gang Involvement:No   Subjective: Client reported big changes ahead for her husband's job and her family. Client anxious but ready for a change for their family. Clients' husband is about to announce to their congregation he is taking a job as a Education officer, environmental at a new church & client praying it will go [place] & they wont be vindictive towards them. 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 Client processed her stressors and therapist used MI & CBT with client to help validate her feelings and encourage client to process through her triggers. Client looking forward to going on vacation with her sister and therapist reminded client of the use of mindfulness for restoring herself. Therapist assessed for stability, and client denied SI/HI/AVH.  Interventions: Cognitive Behavioral Therapy, Mindfulness Meditation, and Motivational Interviewing   Diagnosis:   ICD-10-CM   1. Episodic mood disorder (HCC)  F39        Plan of Care:  Client to return  for weekly therapy with Zoila Shutter, therapist, to review again in 6 months.  Client to engage in positive self talk and challenging negative internal ruminations and self talk causing client to be overly anxious and worried using CBT, on daily practice. Client to engage in mindfulness: ie body scans each eveneing to help process and discharge emotional distress & recognize emotions. Client to utilize BSP (brainspotting) with therapist to help client regulate their anxiety in a somatic- felt body sense way: (ie by working to reduce muscle tension, ruminations, increased heart rate, constant worrying and feeling "hyper" ) by decreasing anxiety by 33% in the next 6 months.  Client to understand how to succeed in fighting back worry and embrace life's uncertainty. Client to prioritize sleep 8+ hours each week night AEB going to bed by 10pm each night.  Client participated in the treatment planning of their therapy.  Client agreed with the plan if there is a crisis: contact after hours office line, call 9-1-1 and/or crisis line given by therapist.  [name], LCSW, LCAS, CCTP, CCS-I, BSP

## 2021-02-13 ENCOUNTER — Other Ambulatory Visit: Payer: Self-pay

## 2021-02-13 ENCOUNTER — Ambulatory Visit: Payer: BC Managed Care – PPO | Admitting: Addiction (Substance Use Disorder)

## 2021-02-13 DIAGNOSIS — F39 Unspecified mood [affective] disorder: Secondary | ICD-10-CM

## 2021-02-13 NOTE — Progress Notes (Signed)
°      [facility] Counselor/Therapist Progress Note  Patient ID: Sharon [name], MRN: [id number],    Date: 02/13/2021  Time Spent: 55 mins   Treatment Type: Individual Therapy  Reported Symptoms: feeling like a failure, anxious, not sleeping  Mental Status Exam:  Appearance:   Casual     Behavior:  Appropriate and Sharing  Motor:  Normal  Speech/Language:   Clear and Coherent and Normal Rate  Affect:  Appropriate and Congruent  Mood:  anxious, irritable, labile, and sad  Thought process:  normal  Thought content:    Rumination  Sensory/Perceptual disturbances:    WNL  Orientation:  x4  Attention:  Good  Concentration:  Good  Memory:  WNL  Fund of knowledge:   Good  Insight:    Good  Judgment:   Good  Impulse Control:  Good   Risk Assessment: Danger to Self:  No Self-injurious Behavior: No Danger to Others: No Duty to Warn:no Physical Aggression / Violence:No  Access to Firearms a concern: No  Gang Involvement:No   Subjective: Client reported feeling like a failure with her husband and her daughter, being really anxious, not sleeping because her daughter wont sleep without her in the room and wont let her get any sleep. Client processed her stressors and therapist used MI & CBT with client to help validate her feelings of frustration with not being able to meet their unrealistic expectations of being everything to them all the time. Client's husband needs to talk with her every day when she is gone on vacation and it makes her feel like she is being suffocated. Therapist used MI & CBT & SFT with client to validate her frustration, help her process her feelings about these conflicts and thoughts she wants to share to try to work through the issue/disagreement with her husband. Therapist assessed for stability, and client denied SI/HI/AVH.  Interventions: Cognitive Behavioral Therapy, Motivational Interviewing, and Solution-Oriented/Positive Psychology   Diagnosis:    ICD-10-CM   1. Episodic mood disorder ([place])  F39       Plan of Care:  Client to return for weekly therapy with Sammuel Cooper, therapist, to review again in 6 months.  Client to engage in positive self talk and challenging negative internal ruminations and self talk causing client to be overly anxious and worried using CBT, on daily practice. Client to engage in mindfulness: ie body scans each eveneing to help process and discharge emotional distress & recognize emotions. Client to utilize BSP (brainspotting) with therapist to help client regulate their anxiety in a somatic- felt body sense way: (ie by working to reduce muscle tension, ruminations, increased heart rate, constant worrying and feeling "hyper" ) by decreasing anxiety by 33% in the next 6 months.  Client to understand how to succeed in fighting back worry and embrace life's uncertainty. Client to prioritize sleep 8+ hours each week night AEB going to bed by 10pm each night.  Client participated in the treatment planning of their therapy.  Client agreed with the plan if there is a crisis: contact after hours office line, call 9-1-1 and/or crisis line given by therapist.  Barnie Del, LCSW, LCAS, CCTP, CCS-I, BSP

## 2021-02-20 ENCOUNTER — Ambulatory Visit: Payer: BC Managed Care – PPO | Admitting: Addiction (Substance Use Disorder)

## 2021-02-20 ENCOUNTER — Other Ambulatory Visit: Payer: Self-pay

## 2021-02-20 DIAGNOSIS — F39 Unspecified mood [affective] disorder: Secondary | ICD-10-CM | POA: Diagnosis not present

## 2021-02-20 NOTE — Progress Notes (Signed)
°      [facility] Counselor/Therapist Progress Note  Patient ID: BRAYLA PAT, MRN: [id number],    Date: 02/20/2021  Time Spent:  Treatment Type: Individual Therapy  Reported Symptoms: anxiety, emotional   Mental Status Exam:  Appearance:   Casual     Behavior:  Appropriate and Sharing  Motor:  Normal  Speech/Language:   Clear and Coherent and Normal Rate  Affect:  Appropriate and Congruent  Mood:  angry, anxious, labile, and sad  Thought process:  normal  Thought content:    Rumination  Sensory/Perceptual disturbances:    WNL  Orientation:  x4  Attention:  Good  Concentration:  Good  Memory:  WNL  Fund of knowledge:   Good  Insight:    Good  Judgment:   Good  Impulse Control:  Fair   Risk Assessment: Danger to Self:  No Self-injurious Behavior: No Danger to Others: No Duty to Warn:no Physical Aggression / Violence:No  Access to Firearms a concern: No  Gang Involvement:No   Subjective: Client reported the anxiety of what's happening with her husband- waiting to be voted in by a new church congregation, while their current church is telling everyone he is leaving, causing unnecessary chaos for them and the congregation. Client processed a conflict her husband had with the layleader who was telling everyone and client angrily processed what she really wanted to tell her; therapist used MI & Gestalt and was a validating sounding board for the client to say what she needed to say about her frustration of what she and their family are going through during this time. Therapist also used CBT with client as she processed her emotions and body stressors in session. Therapist assessed for stability, and client denied SI/HI/AVH.  Interventions: Cognitive Behavioral Therapy, Motivational Interviewing, and Gestalt/Psychodrama   Diagnosis:   ICD-10-CM   1. Episodic mood disorder (HCC)  F39       Plan of Care:  Client to return for weekly therapy with Zoila Shutter,  therapist, to review again in 6 months.  Client to engage in positive self talk and challenging negative internal ruminations and self talk causing client to be overly anxious and worried using CBT, on daily practice. Client to engage in mindfulness: ie body scans each eveneing to help process and discharge emotional distress & recognize emotions. Client to utilize BSP (brainspotting) with therapist to help client regulate their anxiety in a somatic- felt body sense way: (ie by working to reduce muscle tension, ruminations, increased heart rate, constant worrying and feeling "hyper" ) by decreasing anxiety by 33% in the next 6 months.  Client to understand how to succeed in fighting back worry and embrace life's uncertainty. Client to prioritize sleep 8+ hours each week night AEB going to bed by 10pm each night.  Client participated in the treatment planning of their therapy.  Client agreed with the plan if there is a crisis: contact after hours office line, call 9-1-1 and/or crisis line given by therapist.  [name], LCSW, LCAS, CCTP, CCS-I, BSP

## 2021-02-27 ENCOUNTER — Ambulatory Visit (INDEPENDENT_AMBULATORY_CARE_PROVIDER_SITE_OTHER): Payer: BC Managed Care – PPO | Admitting: Addiction (Substance Use Disorder)

## 2021-02-27 ENCOUNTER — Other Ambulatory Visit: Payer: Self-pay

## 2021-02-27 DIAGNOSIS — F428 Other obsessive-compulsive disorder: Secondary | ICD-10-CM

## 2021-02-27 DIAGNOSIS — F411 Generalized anxiety disorder: Secondary | ICD-10-CM | POA: Diagnosis not present

## 2021-02-28 NOTE — Progress Notes (Signed)
°      [facility] Counselor/Therapist Progress Note  Patient ID: Sharon [name], MRN: [id number],    Date: 02/28/2021  Time Spent:  Treatment Type: Individual Therapy  Reported Symptoms: upset, nervous  Mental Status Exam:  Appearance:   Casual     Behavior:  Appropriate and Sharing  Motor:  Normal  Speech/Language:   Clear and Coherent and Normal Rate  Affect:  Appropriate and Congruent  Mood:  anxious  Thought process:  normal  Thought content:    Rumination  Sensory/Perceptual disturbances:    WNL  Orientation:  x4  Attention:  Good  Concentration:  Good  Memory:  WNL  Fund of knowledge:   Good  Insight:    Good  Judgment:   Good  Impulse Control:  Good   Risk Assessment: Danger to Self:  No Self-injurious Behavior: No Danger to Others: No Duty to Warn:no Physical Aggression / Violence:No  Access to Firearms a concern: No  Gang Involvement:No   Subjective: Client reported her feelings of anxiety of ruining others counseling experiences due to her need to save them from their feelings at times. Client has such a strong love to care for them well, that anything less than perfection makes her feel like shes doing them harm. Therapist used MI to affirm client's judgement and wise decision making process with these troubled feelings. Therapist also used Cbt to help client realize the difference in caring for them and being responsible for them. 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 Therapist assessed for stability, and client denied SI/HI/AVH.  Interventions: Cognitive Behavioral Therapy and Motivational Interviewing   Diagnosis:   ICD-10-CM   1. Obsessional thoughts  F42.8     2. Generalized anxiety disorder  F41.1        Plan of Care:  Client to return for weekly therapy with Zoila Shutter, therapist, to review again in 6 months.  Client to engage in positive self talk and challenging negative  internal ruminations and self talk causing client to be overly anxious and worried using CBT, on daily practice. Client to engage in mindfulness: ie body scans each eveneing to help process and discharge emotional distress & recognize emotions. Client to utilize BSP (brainspotting) with therapist to help client regulate their anxiety in a somatic- felt body sense way: (ie by working to reduce muscle tension, ruminations, increased heart rate, constant worrying and feeling "hyper" ) by decreasing anxiety by 33% in the next 6 months.  Client to understand how to succeed in fighting back worry and embrace life's uncertainty. Client to prioritize sleep 8+ hours each week night AEB going to bed by 10pm each night.  Client participated in the treatment planning of their therapy.  Client agreed with the plan if there is a crisis: contact after hours office line, call 9-1-1 and/or crisis line given by therapist.  [name], LCSW, LCAS, CCTP, CCS-I, BSP

## 2021-03-06 ENCOUNTER — Ambulatory Visit: Payer: BC Managed Care – PPO | Admitting: Addiction (Substance Use Disorder)

## 2021-03-06 ENCOUNTER — Other Ambulatory Visit: Payer: Self-pay

## 2021-03-06 DIAGNOSIS — F411 Generalized anxiety disorder: Secondary | ICD-10-CM | POA: Diagnosis not present

## 2021-03-06 DIAGNOSIS — F428 Other obsessive-compulsive disorder: Secondary | ICD-10-CM | POA: Diagnosis not present

## 2021-03-06 NOTE — Progress Notes (Signed)
°      [facility] Counselor/Therapist Progress Note  Patient ID: Sharon [name], MRN: [id number],    Date: 03/06/2021  Time Spent:  Treatment Type: Individual Therapy  Reported Symptoms: tired, irritated, worried  Mental Status Exam:  Appearance:   Casual     Behavior:  Appropriate and Sharing  Motor:  Normal  Speech/Language:   Clear and Coherent and Normal Rate  Affect:  Appropriate and Congruent  Mood:  anxious  Thought process:  normal  Thought content:    Obsessions and Rumination  Sensory/Perceptual disturbances:    WNL  Orientation:  x4  Attention:  Good  Concentration:  Good  Memory:  WNL  Fund of knowledge:   Good  Insight:    Good  Judgment:   Good  Impulse Control:  Good   Risk Assessment: Danger to Self:  No Self-injurious Behavior: No Danger to Others: No Duty to Warn:no Physical Aggression / Violence:No  Access to Firearms a concern: No  Gang Involvement:No   Subjective: Client reported back about her continued processing of 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 Client reported having a conversation with her layleader about the conflict between them and how she feels less frustrated with her behavior because she has made peace with the facet that the relationship has fizzled. Therapist also used Cbt to help client realize the difference in caring for them and being responsible for them. Client able to recognize the roots of having more compassion for others and putting her needs below those: her childhood and legalistic church as a child, making her feel responsible for their salvation. Therapist assessed for stability, and client denied SI/HI/AVH.  Interventions: Cognitive Behavioral Therapy and Motivational Interviewing   Diagnosis:   ICD-10-CM   1. Generalized anxiety disorder  F41.1     2. Episodic mood disorder (HCC)  F39     3. Obsessional thoughts  F42.8       Plan of Care:  Client to return for weekly therapy  with Zoila Shutter, therapist, to review again in 6 months.  Client to engage in positive self talk and challenging negative internal ruminations and self talk causing client to be overly anxious and worried using CBT, on daily practice. Client to engage in mindfulness: ie body scans each eveneing to help process and discharge emotional distress & recognize emotions. Client to utilize BSP (brainspotting) with therapist to help client regulate their anxiety in a somatic- felt body sense way: (ie by working to reduce muscle tension, ruminations, increased heart rate, constant worrying and feeling "hyper" ) by decreasing anxiety by 33% in the next 6 months.  Client to understand how to succeed in fighting back worry and embrace life's uncertainty. Client to prioritize sleep 8+ hours each week night AEB going to bed by 10pm each night.  Client participated in the treatment planning of their therapy.  Client agreed with the plan if there is a crisis: contact after hours office line, call 9-1-1 and/or crisis line given by therapist.  [name], LCSW, LCAS, CCTP, CCS-I, BSP

## 2021-03-13 ENCOUNTER — Other Ambulatory Visit: Payer: Self-pay

## 2021-03-13 ENCOUNTER — Ambulatory Visit: Payer: BC Managed Care – PPO | Admitting: Addiction (Substance Use Disorder)

## 2021-03-13 DIAGNOSIS — F411 Generalized anxiety disorder: Secondary | ICD-10-CM

## 2021-03-13 DIAGNOSIS — F428 Other obsessive-compulsive disorder: Secondary | ICD-10-CM

## 2021-03-13 DIAGNOSIS — F39 Unspecified mood [affective] disorder: Secondary | ICD-10-CM | POA: Diagnosis not present

## 2021-03-13 NOTE — Progress Notes (Signed)
°      [facility] Counselor/Therapist Progress Note  Patient ID: Sharon [name], MRN: [id number],    Date: 03/13/2021  Time Spent: 54 mins  Treatment Type: Individual Therapy  Reported Symptoms: nervous  Mental Status Exam:  Appearance:   Casual     Behavior:  Appropriate and Sharing  Motor:  Normal  Speech/Language:   Clear and Coherent and Normal Rate  Affect:  Appropriate and Congruent  Mood:  anxious  Thought process:  normal  Thought content:    Obsessions and Rumination  Sensory/Perceptual disturbances:    WNL  Orientation:  x4  Attention:  Good  Concentration:  Good  Memory:  WNL  Fund of knowledge:   Good  Insight:    Good  Judgment:   Good  Impulse Control:  Good   Risk Assessment: Danger to Self:  No Self-injurious Behavior: No Danger to Others: No Duty to Warn:no Physical Aggression / Violence:No  Access to Firearms a concern: No  Gang Involvement:No   Subjective: Client reported ongoing anxiety about things being like a desert in the new church. 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 Client shared about issues she wanted to address in the last church but her ability to trust her husband's judgement. Client struggling with letting go of control of the unknown and processed with therapist her fear and thoughts about the worst things that could happen in their new church. Therapist used MI & CBT with client to validate the challening dry season they had with their old congregation and to help client process her fears and challenge irrational fears about the future church. Therapist assessed for stability, and client denied SI/HI/AVH.  Interventions: Cognitive Behavioral Therapy and Motivational Interviewing   Diagnosis:   ICD-10-CM   1. Episodic mood disorder (HCC)  F39     2. Obsessional thoughts  F42.8     3. Generalized anxiety disorder   F41.1       Plan of Care:  Client to return for weekly therapy with Sammuel Cooper, therapist, to review again in 6 months.  Client to engage in positive self talk and challenging negative internal ruminations and self talk causing client to be overly anxious and worried using CBT, on daily practice. Client to engage in mindfulness: ie body scans each eveneing to help process and discharge emotional distress & recognize emotions. Client to utilize BSP (brainspotting) with therapist to help client regulate their anxiety in a somatic- felt body sense way: (ie by working to reduce muscle tension, ruminations, increased heart rate, constant worrying and feeling "hyper" ) by decreasing anxiety by 33% in the next 6 months.  Client to understand how to succeed in fighting back worry and embrace life's uncertainty. Client to prioritize sleep 8+ hours each week night AEB going to bed by 10pm each night.  Client participated in the treatment planning of their therapy.  Client agreed with the plan if there is a crisis: contact after hours office line, call 9-1-1 and/or crisis line given by therapist.  Barnie Del, LCSW, LCAS, CCTP, CCS-I, BSP

## 2021-03-25 DIAGNOSIS — Z803 Family history of malignant neoplasm of breast: Secondary | ICD-10-CM | POA: Diagnosis not present

## 2021-03-25 DIAGNOSIS — Z1239 Encounter for other screening for malignant neoplasm of breast: Secondary | ICD-10-CM | POA: Diagnosis not present

## 2021-03-25 DIAGNOSIS — R922 Inconclusive mammogram: Secondary | ICD-10-CM | POA: Diagnosis not present

## 2021-03-25 DIAGNOSIS — Z1231 Encounter for screening mammogram for malignant neoplasm of breast: Secondary | ICD-10-CM | POA: Diagnosis not present

## 2021-04-10 ENCOUNTER — Ambulatory Visit (INDEPENDENT_AMBULATORY_CARE_PROVIDER_SITE_OTHER): Payer: BC Managed Care – PPO | Admitting: Addiction (Substance Use Disorder)

## 2021-04-10 ENCOUNTER — Other Ambulatory Visit: Payer: Self-pay

## 2021-04-10 DIAGNOSIS — F411 Generalized anxiety disorder: Secondary | ICD-10-CM

## 2021-04-10 NOTE — Progress Notes (Signed)
?      [facility] Counselor/Therapist Progress Note ? ?Patient ID: Sharon [name], MRN: [id number],   ? ?Date: 04/10/2021 ? ?Time Spent: 98mins ? ?Treatment Type: Individual Therapy ? ?Reported Symptoms: stressed, anxious, felt out of control  ? ?Mental Status Exam: ? ?Appearance:   Casual     ?Behavior:  Appropriate and Sharing  ?Motor:  Normal  ?Speech/Language:   Clear and Coherent and Normal Rate  ?Affect:  Appropriate and Congruent  ?Mood:  anxious  ?Thought process:  normal  ?Thought content:    Rumination  ?Sensory/Perceptual disturbances:    WNL  ?Orientation:  x4  ?Attention:  Good  ?Concentration:  Good  ?Memory:  WNL  ?Fund of knowledge:   Good  ?Insight:    Good  ?Judgment:   Good  ?Impulse Control:  Good  ? ?Risk Assessment: ?Danger to Self:  No ?Self-injurious Behavior: No ?Danger to Others: No ?Duty to Warn:no ?Physical Aggression / Violence:No  ?Access to Firearms a concern: No  ?Gang Involvement:No  ? ?Subjective: Client reported not being able to take care of herself and work out due to her dog's surgery, family drama and health issues, and her lack of sleep for herself and her daughter. Client reported feeling stressed, anxious, and felt out of control in her life the last 4-6 weeks. Client struggled with processing all of those life stressors and not let it hurt her mental state. Client shared that she felt like she was in a pit that she had to crawl out of the last 1-2 weeks. Client feels stretched in all areas of her personal and professional life and also was shorted a month of money from [place]. Therapist used MI & CBT with client to support her in processing her feelings and stressors over the last few weeks. Therapist expressed validation for the client's grief & anxiety surrounding these life stressors. Therapist assessed for stability, and client denied SI/HI/AVH. ? ?Interventions: Cognitive Behavioral Therapy and Motivational Interviewing  ? ?Diagnosis: ?  ICD-10-CM   ?1. Generalized  anxiety disorder  F41.1   ?  ? ? ?Plan of Care:  ?Client to return for weekly therapy with Sammuel Cooper, therapist, to review again in 6 months.  ?Client to engage in positive self talk and challenging negative internal ruminations and self talk causing client to be overly anxious and worried using CBT, on daily practice. ?Client to engage in mindfulness: ie body scans each eveneing to help process and discharge emotional distress & recognize emotions. ?Client to utilize BSP (brainspotting) with therapist to help client regulate their anxiety in a somatic- felt body sense way: (ie by working to reduce muscle tension, ruminations, increased heart rate, constant worrying and feeling "hyper" ) by decreasing anxiety by 33% in the next 6 months.  ?Client to understand how to succeed in fighting back worry and embrace life's uncertainty. ?Client to prioritize sleep 8+ hours each week night AEB going to bed by 10pm each night.  ?Client participated in the treatment planning of their therapy.  ?Client agreed with the plan if there is a crisis: contact after hours office line, call 9-1-1 and/or crisis line given by therapist. ? ?Barnie Del, LCSW, LCAS, CCTP, CCS-I, BSP ?

## 2021-04-17 ENCOUNTER — Other Ambulatory Visit: Payer: Self-pay

## 2021-04-17 ENCOUNTER — Ambulatory Visit: Payer: BC Managed Care – PPO | Admitting: Addiction (Substance Use Disorder)

## 2021-04-17 DIAGNOSIS — F411 Generalized anxiety disorder: Secondary | ICD-10-CM

## 2021-04-17 NOTE — Progress Notes (Signed)
?      [facility] Counselor/Therapist Progress Note ? ?Patient ID: Sharon [name], MRN: [id number],   ? ?Date: 04/17/2021 ? ?Time Spent: ? ?Treatment Type: Individual Therapy ? ?Reported Symptoms: nervous, guilty ? ?Mental Status Exam: ? ?Appearance:   Casual     ?Behavior:  Appropriate and Sharing  ?Motor:  Normal  ?Speech/Language:   Clear and Coherent and Normal Rate  ?Affect:  Appropriate and Congruent  ?Mood:  anxious  ?Thought process:  normal  ?Thought content:    Rumination  ?Sensory/Perceptual disturbances:    WNL  ?Orientation:  x4  ?Attention:  Good  ?Concentration:  Good  ?Memory:  WNL  ?Fund of knowledge:   Good  ?Insight:    Good  ?Judgment:   Good  ?Impulse Control:  Good  ? ?Risk Assessment: ?Danger to Self:  No ?Self-injurious Behavior: No ?Danger to Others: No ?Duty to Warn:no ?Physical Aggression / Violence:No  ?Access to Firearms a concern: No  ?Gang Involvement:No  ? 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 Therapist used MI & BSP with client to help her tease out her feelings of guilt and process internally what is upsetting her about that. Client made progress and was able to find lower SUDs in her chest related to charging them when she needs to. Therapist expressed validation for the client's anxiety surrounding these stressors. Therapist assessed for stability, and client denied SI/HI/AVH. ? ?Interventions: Cognitive Behavioral Therapy, Motivational Interviewing, and BSP   ? ?Diagnosis: ?  ICD-10-CM   ?1. Generalized anxiety disorder  F41.1   ?  ? ? ? ?Plan of Care:  ?Client to return for weekly therapy with Zoila Shutter, therapist, to review again in 6 months.  ?Client to engage in positive self talk and challenging negative internal ruminations and self talk causing client to be overly anxious and worried using CBT, on daily practice. ?Client to engage in mindfulness: ie body scans each eveneing to help process and discharge  emotional distress & recognize emotions. ?Client to utilize BSP (brainspotting) with therapist to help client regulate their anxiety in a somatic- felt body sense way: (ie by working to reduce muscle tension, ruminations, increased heart rate, constant worrying and feeling "hyper" ) by decreasing anxiety by 33% in the next 6 months.  ?Client to understand how to succeed in fighting back worry and embrace life's uncertainty. ?Client to prioritize sleep 8+ hours each week night AEB going to bed by 10pm each night.  ?Client participated in the treatment planning of their therapy.  ?Client agreed with the plan if there is a crisis: contact after hours office line, call 9-1-1 and/or crisis line given by therapist. ? ?[name], LCSW, LCAS, CCTP, CCS-I, BSP ?

## 2021-04-24 ENCOUNTER — Ambulatory Visit (INDEPENDENT_AMBULATORY_CARE_PROVIDER_SITE_OTHER): Payer: BC Managed Care – PPO | Admitting: Addiction (Substance Use Disorder)

## 2021-04-24 ENCOUNTER — Other Ambulatory Visit: Payer: Self-pay

## 2021-04-24 DIAGNOSIS — F411 Generalized anxiety disorder: Secondary | ICD-10-CM

## 2021-04-24 NOTE — Progress Notes (Signed)
?      [facility] Counselor/Therapist Progress Note ? ?Patient ID: Sharon [name], MRN: [id number],   ? ?Date: 04/24/2021 ? ?Time Spent: ? ?Treatment Type: Individual Therapy ? ?Reported Symptoms: sad ? ?Mental Status Exam: ? ?Appearance:   Casual     ?Behavior:  Appropriate and Sharing  ?Motor:  Normal  ?Speech/Language:   Clear and Coherent and Normal Rate  ?Affect:  Appropriate, Congruent, and Tearful  ?Mood:  sad  ?Thought process:  normal  ?Thought content:    Rumination  ?Sensory/Perceptual disturbances:    WNL  ?Orientation:  x4  ?Attention:  Good  ?Concentration:  Good  ?Memory:  WNL  ?Fund of knowledge:   Good  ?Insight:    Good  ?Judgment:   Good  ?Impulse Control:  Good  ? ?Risk Assessment: ?Danger to Self:  No ?Self-injurious Behavior: No ?Danger to Others: No ?Duty to Warn:no ?Physical Aggression / Violence:No  ?Access to Firearms a concern: No  ?Gang Involvement:No  ? ?Subjective: Client reported feeling wobbly and sad/ insecure. Client processed the roots of this using BSP. Client found a spot related to desiring a need for approval from her older sister and her mom. Client feels rejected by them and their unrealistic expectations of the client, esp as a child. Therapist used MI & CBT with client to validate the sadness client is feeling and help her process the emotions and thoughts related to this approval. Client's SUDs reduced to a 2/10 after processing. Therapist expressed validation for the client's anxiety surrounding these stressors. Therapist assessed for stability, and client denied SI/HI/AVH. ? ? ?Interventions: Cognitive Behavioral Therapy, Motivational Interviewing, and BSP   ? ?Diagnosis: ?  ICD-10-CM   ?1. Generalized anxiety disorder  F41.1   ?  ? ? ? ?Plan of Care:  ?Client to return for weekly therapy with Zoila Shutter, therapist, to review again in 6 months.  ?Client to engage in positive self talk and challenging negative internal ruminations and self talk causing client to be  overly anxious and worried using CBT, on daily practice. ?Client to engage in mindfulness: ie body scans each eveneing to help process and discharge emotional distress & recognize emotions. ?Client to utilize BSP (brainspotting) with therapist to help client regulate their anxiety in a somatic- felt body sense way: (ie by working to reduce muscle tension, ruminations, increased heart rate, constant worrying and feeling "hyper" ) by decreasing anxiety by 33% in the next 6 months.  ?Client to understand how to succeed in fighting back worry and embrace life's uncertainty. ?Client to prioritize sleep 8+ hours each week night AEB going to bed by 10pm each night.  ?Client participated in the treatment planning of their therapy.  ?Client agreed with the plan if there is a crisis: contact after hours office line, call 9-1-1 and/or crisis line given by therapist. ? ?[name], LCSW, LCAS, CCTP, CCS-I, BSP ?

## 2021-06-06 ENCOUNTER — Ambulatory Visit: Payer: BC Managed Care – PPO | Admitting: Family Medicine

## 2021-06-12 DIAGNOSIS — Z6827 Body mass index (BMI) 27.0-27.9, adult: Secondary | ICD-10-CM | POA: Diagnosis not present

## 2021-06-12 DIAGNOSIS — Z01419 Encounter for gynecological examination (general) (routine) without abnormal findings: Secondary | ICD-10-CM | POA: Diagnosis not present

## 2021-06-12 DIAGNOSIS — Z124 Encounter for screening for malignant neoplasm of cervix: Secondary | ICD-10-CM | POA: Diagnosis not present

## 2021-06-19 ENCOUNTER — Ambulatory Visit: Payer: BC Managed Care – PPO | Admitting: Family Medicine

## 2021-06-19 ENCOUNTER — Ambulatory Visit: Payer: BC Managed Care – PPO | Admitting: Addiction (Substance Use Disorder)

## 2021-06-26 ENCOUNTER — Ambulatory Visit: Payer: BC Managed Care – PPO | Admitting: Addiction (Substance Use Disorder)

## 2021-07-03 ENCOUNTER — Ambulatory Visit (INDEPENDENT_AMBULATORY_CARE_PROVIDER_SITE_OTHER): Payer: BC Managed Care – PPO | Admitting: Addiction (Substance Use Disorder)

## 2021-07-03 DIAGNOSIS — F428 Other obsessive-compulsive disorder: Secondary | ICD-10-CM | POA: Diagnosis not present

## 2021-07-03 DIAGNOSIS — F411 Generalized anxiety disorder: Secondary | ICD-10-CM

## 2021-07-03 NOTE — Progress Notes (Signed)
      [facility] Counselor/Therapist Progress Note  Patient ID: Sharon [name], MRN: [id number],    Date: 07/03/2021  Time Spent:  Treatment Type: Individual Therapy  Reported Symptoms: stressed, feeling responsible  Mental Status Exam:  Appearance:   Casual     Behavior:  Appropriate and Sharing  Motor:  Normal  Speech/Language:   Clear and Coherent and Normal Rate  Affect:  Appropriate and Congruent  Mood:  anxious, labile, and sad  Thought process:  flight of ideas  Thought content:    Obsessions and Rumination  Sensory/Perceptual disturbances:    WNL  Orientation:  x4  Attention:  Good  Concentration:  Good  Memory:  WNL  Fund of knowledge:   Good  Insight:    Good  Judgment:   Fair  Impulse Control:  Good   Risk Assessment: Danger to Self:  No Self-injurious Behavior: No Danger to Others: No Duty to Warn:no Physical Aggression / Violence:No  Access to Firearms a concern: No  Gang Involvement:No   Subjective: Client reported feeling responsible to do all things with her family, friends, and clients. Client processed her frustration with her husband who is expecting from her what she cant give to him regularly when she is emotionally exhausted. Therapist used MI & CBT with client to validate her frustration and support her as she processed her thoughts about his expectations and her desire to please but exhaustion with trying to. Therapist encouraged client to seek marital counseling for them both. Client processed the stress of also feeling the need to regulate her sister who has been suicidal multiple times in the recent past. Client also had a client, who had a neglected childhood, honestly disclose that she was suicidal that she had to get into the hospital and stabilize. Client just feeling overwhelmed like she was dropping all the spinning plates in her life during that time and feeling like shes not measuring up. Therapist expressed validation for the  client's anxiety surrounding these stressors. Therapist assessed for stability, and client denied SI/HI/AVH.  Interventions: Cognitive Behavioral Therapy and Motivational Interviewing   Diagnosis:   ICD-10-CM   1. Generalized anxiety disorder  F41.1     2. Obsessional thoughts  F42.8       Plan of Care:  Client to return for weekly therapy with Sharon [name], therapist, to review again in 6 months.  Client to engage in positive self talk and challenging negative internal ruminations and self talk causing client to be overly anxious and worried using CBT, on daily practice. Client to engage in mindfulness: ie body scans each eveneing to help process and discharge emotional distress & recognize emotions. Client to utilize BSP (brainspotting) with therapist to help client regulate their anxiety in a somatic- felt body sense way: (ie by working to reduce muscle tension, ruminations, increased heart rate, constant worrying and feeling "hyper" ) by decreasing anxiety by 33% in the next 6 months.  Client to understand how to succeed in fighting back worry and embrace life's uncertainty. Client to prioritize sleep 8+ hours each week night AEB going to bed by 10pm each night.  Client agreed with the plan if there is a crisis: contact after hours office line, call 9-1-1 and/or crisis line given by therapist. 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   [name], LCSW, LCAS, CCTP, CCS-I, BSP

## 2021-07-10 ENCOUNTER — Ambulatory Visit: Payer: BC Managed Care – PPO | Admitting: Addiction (Substance Use Disorder)

## 2021-07-11 DIAGNOSIS — H16223 Keratoconjunctivitis sicca, not specified as Sjogren's, bilateral: Secondary | ICD-10-CM | POA: Diagnosis not present

## 2021-07-17 ENCOUNTER — Ambulatory Visit (INDEPENDENT_AMBULATORY_CARE_PROVIDER_SITE_OTHER): Payer: BC Managed Care – PPO | Admitting: Addiction (Substance Use Disorder)

## 2021-07-17 DIAGNOSIS — F411 Generalized anxiety disorder: Secondary | ICD-10-CM

## 2021-07-17 NOTE — Progress Notes (Signed)
      [facility] Counselor/Therapist Progress Note  Patient ID: Sharon [name], MRN: [id number],    Date: 07/17/2021  Time Spent:  Treatment Type: Individual Therapy  Reported Symptoms: stressed, feeling responsible  Mental Status Exam:  Appearance:   Casual     Behavior:  Appropriate and Sharing  Motor:  Normal  Speech/Language:   Clear and Coherent and Normal Rate  Affect:  Appropriate and Congruent  Mood:  anxious  Thought process:  normal  Thought content:    Rumination  Sensory/Perceptual disturbances:    WNL  Orientation:  x4  Attention:  Good  Concentration:  Good  Memory:  WNL  Fund of knowledge:   Good  Insight:    Good  Judgment:   Good  Impulse Control:  Good   Risk Assessment: Danger to Self:  No Self-injurious Behavior: No Danger to Others: No Duty to Warn:no Physical Aggression / Violence:No  Access to Firearms a concern: No  Gang Involvement:No   Subjective: Client reported feeling stressed about her client who was at increased risk. Client processed the stress of needing to regulate and build a safety plan for her client who has been struggling with increased suicidality. Client processed some key parts of the issue that are distressing for her and make her feel such weightiness and worry about her client. Therapist used MI & CBT with client to validate the stressful responsibility client is feeling, affirming the good clinical work client is attempting to do to keep her safe, and to help the client process her feelings until her thoughts become more clear. Client made progress not taking on the responsibility of it and stressing internally about the client after processing with the therapist.  Therapist assessed for stability, and client denied SI/HI/AVH.  Interventions: Cognitive Behavioral Therapy and Motivational Interviewing   Diagnosis: No diagnosis found.   Plan of Care:   Client to return for weekly therapy with Zoila Shutter, therapist,  to review again in 6 months.  Client to engage in positive self talk and challenging negative internal ruminations and self talk causing client to be overly anxious and worried using CBT, on daily practice. Client to engage in mindfulness: ie body scans each eveneing to help process and discharge emotional distress & recognize emotions. Client to utilize BSP (brainspotting) with therapist to help client regulate their anxiety in a somatic- felt body sense way: (ie by working to reduce muscle tension, ruminations, increased heart rate, constant worrying and feeling "hyper" ) by decreasing anxiety by 33% in the next 6 months.  Client to understand how to succeed in fighting back worry and embrace life's uncertainty. Client to prioritize sleep 8+ hours each week night AEB going to bed by 10pm each night.  Client agreed with the plan if there is a crisis: contact after hours office line, call 9-1-1 and/or crisis line given by therapist.  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  Client made progress not taking on the responsibility of it and stressing internally about the client after processing with the therapist.  [name], LCSW, LCAS, CCTP, CCS-I, BSP

## 2021-07-25 ENCOUNTER — Ambulatory Visit: Payer: BC Managed Care – PPO | Admitting: Addiction (Substance Use Disorder)

## 2021-09-05 ENCOUNTER — Ambulatory Visit: Payer: BC Managed Care – PPO | Admitting: Family Medicine

## 2021-09-05 ENCOUNTER — Ambulatory Visit: Payer: BC Managed Care – PPO | Admitting: Addiction (Substance Use Disorder)

## 2021-09-09 ENCOUNTER — Telehealth: Payer: BC Managed Care – PPO | Admitting: Physician Assistant

## 2021-09-09 DIAGNOSIS — N3281 Overactive bladder: Secondary | ICD-10-CM | POA: Diagnosis not present

## 2021-09-09 MED ORDER — OXYBUTYNIN CHLORIDE ER 10 MG PO TB24: 10.0000 mg | ORAL_TABLET | Freq: Every day | ORAL | 0 refills | Status: DC

## 2021-09-09 NOTE — Progress Notes (Signed)
Virtual Visit Consent   Sharon [name], you are scheduled for a virtual visit with a [facility] provider today. Just as with appointments in the office, your consent must be obtained to participate. Your consent will be active for this visit and any virtual visit you may have with one of our providers in the next 365 days. If you have a MyChart account, a copy of this consent can be sent to you electronically.  As this is a virtual visit, video technology does not allow for your provider to perform a traditional examination. This may limit your provider's ability to fully assess your condition. If your provider identifies any concerns that need to be evaluated in person or the need to arrange testing (such as labs, EKG, etc.), we will make arrangements to do so. Although advances in technology are sophisticated, we cannot ensure that it will always work on either your end or our end. If the connection with a video visit is poor, the visit may have to be switched to a telephone visit. With either a video or telephone visit, we are not always able to ensure that we have a secure connection.  By engaging in this virtual visit, you consent to the provision of healthcare and authorize for your insurance to be billed (if applicable) for the services provided during this visit. Depending on your insurance coverage, you may receive a charge related to this service.  I need to obtain your verbal consent now. Are you willing to proceed with your visit today? Sharon [name] has provided verbal consent on 09/09/2021 for a virtual visit (video or telephone). Margaretann Loveless, PA-C  Date: 09/09/2021 9:15 AM  Virtual Visit via Video Note   I, Margaretann Loveless, connected with  Sharon [name]  ([id number], [birth year]) on 09/09/21 at  9:00 AM EDT by a video-enabled telemedicine application and verified that I am speaking with the correct person using two identifiers.  Location: Patient: Virtual Visit Location  Patient: Home Provider: Virtual Visit Location Provider: Home Office   I discussed the limitations of evaluation and management by telemedicine and the availability of in person appointments. The patient expressed understanding and agreed to proceed.    History of Present Illness: Sharon [name] is a 41 y.o. who identifies as a female who was assigned female at birth, and is being seen today for urine frequency.  HPI: Urinary Tract Infection  This is a new problem. The current episode started more than 1 month ago. The problem occurs intermittently. The problem has been gradually worsening. The pain is at a severity of 0/10. The patient is experiencing no pain. There is No history of pyelonephritis. Associated symptoms include frequency. Pertinent negatives include no chills, hematuria, hesitancy or urgency. Associated symptoms comments: Nocturia up to 15 times a night (intermittent), bladder feels heavy; symptoms started over a month ago. Has symptoms last a few days then subsides, then starts back. Now progressing to having urine frequency through the day as well. She has tried nothing for the symptoms. The treatment provided no relief. There is no history of recurrent UTIs.    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   Allergic rhinitis 04/09/2017   Hx of gestational diabetes mellitus, not currently pregnant 03/11/2017    Overweight (BMI 25.0-29.9) 03/11/2017   Disordered sleep 03/11/2017    Allergies: No Known Allergies Medications:  Current Outpatient Medications:    oxybutynin (DITROPAN-XL) 10 MG 24 hr tablet, Take 1 tablet (10 mg total) by mouth at bedtime., Disp: 30 tablet, Rfl: 0   buPROPion (WELLBUTRIN XL) 300 MG 24 hr tablet, TAKE 1 TABLET BY MOUTH EVERY DAY, Disp: 90 tablet, Rfl: 2   FLUoxetine (PROZAC) 20 MG capsule, TAKE 3 CAPSULES BY MOUTH EVERY DAY, Disp: 270 capsule, Rfl: 2   Vitamin D, Ergocalciferol, (DRISDOL) 1.25 MG (50000 UNIT) CAPS capsule, Take 1 capsule (50,000 Units total) by mouth every 7 (seven) days., Disp: 12 capsule, Rfl: 0  Observations/Objective: Patient is well-developed, well-nourished in no acute distress.  Resting comfortably at home.  Head is normocephalic, atraumatic.  No labored breathing.  Speech is clear and coherent with logical content.  Patient is alert and oriented at baseline.    Assessment and Plan: 1. OAB (overactive bladder) - oxybutynin (DITROPAN-XL) 10 MG 24 hr tablet; Take 1 tablet (10 mg total) by mouth at bedtime.  Dispense: 30 tablet; Refill: 0  - Suspect OAB symptoms - Start Oxybutynin as above - Follow up with PCP in 3-4 weeks or sooner if not improving with medication  Follow Up Instructions: I discussed the assessment and treatment plan with the patient. The patient was provided an opportunity to ask questions and all were answered. The patient agreed with the plan and demonstrated an understanding of the instructions.  A copy of instructions were sent to the patient via MyChart unless otherwise noted below.    The patient was advised to call back or seek an in-person evaluation if the symptoms worsen or if the condition fails to improve as anticipated.  Time:  I spent 18 minutes with the patient via telehealth technology discussing the above problems/concerns.    Margaretann Loveless, PA-C

## 2021-09-09 NOTE — Patient Instructions (Signed)
Nichola Sizer, thank you for joining Margaretann Loveless, PA-C for today's virtual visit.  While this provider is not your primary care provider (PCP), if your PCP is located in our provider database this encounter information will be shared with them immediately following your visit.  Consent: (Patient) Nichola Sizer provided verbal consent for this virtual visit at the beginning of the encounter.  Current Medications:  Current Outpatient Medications:    oxybutynin (DITROPAN-XL) 10 MG 24 hr tablet, Take 1 tablet (10 mg total) by mouth at bedtime., Disp: 30 tablet, Rfl: 0   buPROPion (WELLBUTRIN XL) 300 MG 24 hr tablet, TAKE 1 TABLET BY MOUTH EVERY DAY, Disp: 90 tablet, Rfl: 2   FLUoxetine (PROZAC) 20 MG capsule, TAKE 3 CAPSULES BY MOUTH EVERY DAY, Disp: 270 capsule, Rfl: 2   Vitamin D, Ergocalciferol, (DRISDOL) 1.25 MG (50000 UNIT) CAPS capsule, Take 1 capsule (50,000 Units total) by mouth every 7 (seven) days., Disp: 12 capsule, Rfl: 0   Medications ordered in this encounter:  Meds ordered this encounter  Medications   oxybutynin (DITROPAN-XL) 10 MG 24 hr tablet    Sig: Take 1 tablet (10 mg total) by mouth at bedtime.    Dispense:  30 tablet    Refill:  0    Order Specific Question:   Supervising Provider    Answer:   Hyacinth Meeker, BRIAN [[id number]]     *If you need refills on other medications prior to your next appointment, please contact your pharmacy*  Follow-Up: Call back or seek an in-person evaluation if the symptoms worsen or if the condition fails to improve as anticipated.  Other Instructions Overactive Bladder, Adult  Overactive bladder is a condition in which a person has a sudden and frequent need to urinate. A person might also leak urine if he or she cannot get to the bathroom fast enough (urinary incontinence). Sometimes, symptoms can interfere with work or social activities. What are the causes? Overactive bladder is associated with poor nerve signals between your  bladder and your brain. Your bladder may get the signal to empty before it is full. You may also have very sensitive muscles that make your bladder squeeze too soon. This condition may also be caused by other factors, such as: Medical conditions: Urinary tract infection. Infection of nearby tissues. Prostate enlargement. Bladder stones, inflammation, or tumors. Diabetes. Muscle or nerve weakness, especially from these conditions: A spinal cord injury. Stroke. Multiple sclerosis. Parkinson's disease. Other causes: Surgery on the uterus or urethra. Drinking too much caffeine or alcohol. Certain medicines, especially those that eliminate extra fluid in the body (diuretics). Constipation. What increases the risk? You may be at greater risk for overactive bladder if you: Are an older adult. Smoke. Are going through menopause. Have prostate problems. Have a neurological disease, such as stroke, dementia, Parkinson's disease, or multiple sclerosis (MS). Eat or drink alcohol, spicy food, caffeine, and other things that irritate the bladder. Are overweight or obese. What are the signs or symptoms? Symptoms of this condition include a sudden, strong urge to urinate. Other symptoms include: Leaking urine. Urinating 8 or more times a day. Waking up to urinate 2 or more times overnight. How is this diagnosed? This condition may be diagnosed based on: Your symptoms and medical history. A physical exam. Blood or urine tests to check for possible causes, such as infection. You may also need to see a health care provider who specializes in urinary tract problems. This is called a urologist. How is this  treated? Treatment for overactive bladder depends on the cause of your condition and whether it is mild or severe. Treatment may include: Bladder training, such as: Learning to control the urge to urinate by following a schedule to urinate at regular intervals. Doing Kegel exercises to  strengthen the pelvic floor muscles that support your bladder. Special devices, such as: Biofeedback. This uses sensors to help you become aware of your body's signals. Electrical stimulation. This uses electrodes placed inside the body (implanted) or outside the body. These electrodes send gentle pulses of electricity to strengthen the nerves or muscles that control the bladder. Women may use a plastic device, called a pessary, that fits into the vagina and supports the bladder. Medicines, such as: Antibiotics to treat bladder infection. Antispasmodics to stop the bladder from releasing urine at the wrong time. Tricyclic antidepressants to relax bladder muscles. Injections of botulinum toxin type A directly into the bladder tissue to relax bladder muscles. Surgery, such as: A device may be implanted to help manage the nerve signals that control urination. An electrode may be implanted to stimulate electrical signals in the bladder. A procedure may be done to change the shape of the bladder. This is done only in very severe cases. Follow these instructions at home: Eating and drinking  Make diet or lifestyle changes recommended by your health care provider. These may include: Drinking fluids throughout the day and not only with meals. Cutting down on caffeine or alcohol. Eating a healthy and balanced diet to prevent constipation. This may include: Choosing foods that are high in fiber, such as beans, whole grains, and fresh fruits and vegetables. Limiting foods that are high in fat and processed sugars, such as fried and sweet foods. Lifestyle  Lose weight if needed. Do not use any products that contain nicotine or tobacco. These include cigarettes, chewing tobacco, and vaping devices, such as e-cigarettes. If you need help quitting, ask your health care provider. General instructions Take over-the-counter and prescription medicines only as told by your health care provider. If you were  prescribed an antibiotic medicine, take it as told by your health care provider. Do not stop taking the antibiotic even if you start to feel better. Use any implants or pessary as told by your health care provider. If needed, wear pads to absorb urine leakage. Keep a log to track how much and when you drink, and when you need to urinate. This will help your health care provider monitor your condition. Keep all follow-up visits. This is important. Contact a health care provider if: You have a fever or chills. Your symptoms do not get better with treatment. Your pain and discomfort get worse. You have more frequent urges to urinate. Get help right away if: You are not able to control your bladder. Summary Overactive bladder refers to a condition in which a person has a sudden and frequent need to urinate. Several conditions may lead to an overactive bladder. Treatment for overactive bladder depends on the cause and severity of your condition. Making lifestyle changes, doing Kegel exercises, keeping a log, and taking medicines can help with this condition. This information is not intended to replace advice given to you by your health care provider. Make sure you discuss any questions you have with your health care provider. Document Revised: 10/17/2019 Document Reviewed: 10/17/2019 Elsevier Patient Education  2023 Elsevier Inc.   Oxybutynin Extended-Release Tablets What is this medication? OXYBUTYNIN (ox i BYOO ti nin) treats symptoms of an overactive bladder, such as  loss of bladder control or frequent need to urinate. It works by relaxing muscles in the bladder. It belongs to a group of medications called antispasmodics. This medicine may be used for other purposes; ask your health care provider or pharmacist if you have questions. COMMON BRAND NAME(S): Ditropan XL What should I tell my care team before I take this medication? They need to know if you have any of these  conditions: Autonomic neuropathy Dementia Difficulty passing urine Glaucoma Intestinal obstruction Kidney disease Liver disease Myasthenia gravis Parkinson's disease An unusual or allergic reaction to oxybutynin, other medications, foods, dyes, or preservatives Pregnant or trying to get pregnant Breast-feeding How should I use this medication? Take this medication by mouth with a glass of water. Swallow whole, do not crush, cut, or chew. Follow the directions on the prescription label. You can take this medication with or without food. Take your doses at regular intervals. Do not take your medication more often than directed. Talk to your care team about the use of this medication in children. Special care may be needed. While this medication may be prescribed for children as young as 6 years for selected conditions, precautions do apply. Overdosage: If you think you have taken too much of this medicine contact a poison control center or emergency room at once. NOTE: This medicine is only for you. Do not share this medicine with others. What if I miss a dose? If you miss a dose, take it as soon as you can. If it is almost time for your next dose, take only that dose. Do not take double or extra doses. What may interact with this medication? Antihistamines for allergy, cough and cold Atropine Certain medications for bladder problems like oxybutynin, tolterodine Certain medications for Parkinson's disease like benztropine, trihexyphenidyl Certain medications for stomach problems like dicyclomine, hyoscyamine Certain medications for travel sickness like scopolamine Clarithromycin Erythromycin Ipratropium Medications for fungal infections, like fluconazole, itraconazole, ketoconazole or voriconazole This list may not describe all possible interactions. Give your health care provider a list of all the medicines, herbs, non-prescription drugs, or dietary supplements you use. Also tell them  if you smoke, drink alcohol, or use illegal drugs. Some items may interact with your medicine. What should I watch for while using this medication? It may take a few weeks to notice the full benefit from this medication. You may need to limit your intake tea, coffee, caffeinated sodas, and alcohol. These drinks may make your symptoms worse. You may get drowsy or dizzy. Do not drive, use machinery, or do anything that needs mental alertness until you know how this medication affects you. Do not stand or sit up quickly, especially if you are an older patient. This reduces the risk of dizzy or fainting spells. Alcohol may interfere with the effect of this medication. Avoid alcoholic drinks. Your mouth may get dry. Chewing sugarless gum or sucking hard candy, and drinking plenty of water may help. Contact your care team if the problem does not go away or is severe. This medication may cause dry eyes and blurred vision. If you wear contact lenses, you may feel some discomfort. Lubricating drops may help. See your eyecare professional if the problem does not go away or is severe. You may notice the shells of the tablets in your stool from time to time. This is normal. Avoid extreme heat. This medication can cause you to sweat less than normal. Your body temperature could increase to dangerous levels, which may lead to heat stroke.  What side effects may I notice from receiving this medication? Side effects that you should report to your care team as soon as possible: Allergic reactions or angioedema--skin rash, itching, hives, swelling of the face, eyes, lips, tongue, arms, or legs, trouble swallowing or breathing Sudden eye pain or change in vision such as blurry vision, seeing halos around lights, vision loss Trouble passing urine Side effects that usually do not require medical attention (report to your care team if they continue or are bothersome): Confusion Constipation Dizziness Drowsiness Dry  mouth Headache This list may not describe all possible side effects. Call your doctor for medical advice about side effects. You may report side effects to FDA at 1-800-FDA-1088. Where should I keep my medication? Keep out of the reach of children. Store at room temperature between 15 and 30 degrees C (59 and 86 degrees F). Protect from moisture and humidity. Throw away any unused medication after the expiration date. NOTE: This sheet is a summary. It may not cover all possible information. If you have questions about this medicine, talk to your doctor, pharmacist, or health care provider.  2023 Elsevier/Gold Standard (2020-06-13 00:00:00)    If you have been instructed to have an in-person evaluation today at a local Urgent Care facility, please use the link below. It will take you to a list of all of our available [facility], including address, phone number and hours of operation. Please do not delay care.  [facility]  If you or a family member do not have a primary care provider, use the link below to schedule a visit and establish care. When you choose a [facility] primary care physician or advanced practice provider, you gain a long-term partner in health. Find a Primary Care Provider  Learn more about [facility]'s in-office and virtual care options: [facility] - Get Care Now

## 2021-09-18 ENCOUNTER — Ambulatory Visit: Payer: BC Managed Care – PPO | Admitting: Family Medicine

## 2021-09-18 ENCOUNTER — Other Ambulatory Visit: Payer: BC Managed Care – PPO

## 2021-09-18 ENCOUNTER — Encounter: Payer: Self-pay | Admitting: Family Medicine

## 2021-09-18 VITALS — BP 114/74 | HR 71 | Temp 97.6°F | Resp 20 | Ht 66.0 in | Wt 174.8 lb

## 2021-09-18 DIAGNOSIS — R351 Nocturia: Secondary | ICD-10-CM | POA: Diagnosis not present

## 2021-09-18 DIAGNOSIS — F3341 Major depressive disorder, recurrent, in partial remission: Secondary | ICD-10-CM | POA: Insufficient documentation

## 2021-09-18 LAB — POCT URINALYSIS DIP (MANUAL ENTRY)
Bilirubin, UA: NEGATIVE
Blood, UA: NEGATIVE
Glucose, UA: NEGATIVE mg/dL
Ketones, POC UA: NEGATIVE mg/dL
Nitrite, UA: NEGATIVE
Spec Grav, UA: >=1.03 — AB (ref 1.010–1.025)
Urobilinogen, UA: 0.2 E.U./dL
pH, UA: 5 (ref 5.0–8.0)

## 2021-09-18 NOTE — Assessment & Plan Note (Signed)
Ongoing issue for pt.  Not currently on medication and not interested in starting due to possibility of weight gain.  She is currently in counseling and 'making do'.  Encouraged her to try and increase frequency of sessions.  Will follow along.

## 2021-09-18 NOTE — Patient Instructions (Signed)
Schedule your complete physical in 6 months We'll notify you of your culture results and start antibiotics if needed If no infection, we'll refer to urology Stop all fluids after 7pm Continue to restrict caffeine Call with any questions or concerns Hang in there!

## 2021-09-18 NOTE — Addendum Note (Signed)
Addended by: Eldred Manges on: 09/18/2021 04:30 PM   Modules accepted: Orders

## 2021-09-18 NOTE — Progress Notes (Signed)
   Subjective:    Patient ID: Sharon [name], female    DOB: [birth year], 41 y.o.   MRN: [id number]  HPI Frequent urination- pt reports sxs occur mostly at night and are interfering w/ sleep.  Sxs started ~6 weeks ago.  No changes in medication, caffeine intake.  Will have a glass of tea between 2-4pm.  No caffeine after that.  No increased water intake.  No burning w/ urination.  Voids will range between normal and less than usual.  Some sense of incomplete emptying.  Pt has had 3 prior UTIs but states this doesn't feel similar.  Had telehealth visit last week and was started on OAB medication- Oxybutynin XL.  Medication has not been helpful.  Depression- previously on Wellbutrin and Fluoxetine but stopped due to weight gain (25-30 lbs)  Currently in counseling.  Not interested in medication due to possibility of weight gain.   Review of Systems For ROS see HPI     Objective:   Physical Exam Vitals reviewed.  Constitutional:      General: She is not in acute distress.    Appearance: Normal appearance. She is not ill-appearing.  Abdominal:     General: Abdomen is flat. There is no distension.     Palpations: Abdomen is soft.     Tenderness: There is no abdominal tenderness (no TTP but sense of fullness in bladder, particularly w/ palpation).  Skin:    General: Skin is warm and dry.  Neurological:     General: No focal deficit present.     Mental Status: She is alert and oriented to person, place, and time.  Psychiatric:        Mood and Affect: Mood normal.        Behavior: Behavior normal.        Thought Content: Thought content normal.           Assessment & Plan:   Urinary frequency- new.  Particularly at night but will also occur during the day.  She has already decreased her caffeine intake and has decreased fluid intake after dinner.  She had a telehealth visit last week and was started on Oxybutynin XL 10mg  daily but this doesn't seem to be helping.  UA shows leuks.  Will  culture and if + start abx.  If negative, will refer to Urology for a complete evaluation.  Pt expressed understanding and is in agreement w/ plan.

## 2021-09-25 ENCOUNTER — Other Ambulatory Visit: Payer: Self-pay

## 2021-09-25 ENCOUNTER — Telehealth: Payer: Self-pay | Admitting: Family Medicine

## 2021-09-25 ENCOUNTER — Other Ambulatory Visit: Payer: Self-pay | Admitting: Family Medicine

## 2021-09-25 ENCOUNTER — Telehealth: Payer: Self-pay

## 2021-09-25 ENCOUNTER — Other Ambulatory Visit: Payer: BC Managed Care – PPO

## 2021-09-25 DIAGNOSIS — R300 Dysuria: Secondary | ICD-10-CM

## 2021-09-25 DIAGNOSIS — Z1501 Genetic susceptibility to malignant neoplasm of breast: Secondary | ICD-10-CM | POA: Diagnosis not present

## 2021-09-25 DIAGNOSIS — Z803 Family history of malignant neoplasm of breast: Secondary | ICD-10-CM | POA: Diagnosis not present

## 2021-09-25 DIAGNOSIS — R351 Nocturia: Secondary | ICD-10-CM | POA: Diagnosis not present

## 2021-09-25 DIAGNOSIS — R922 Inconclusive mammogram: Secondary | ICD-10-CM | POA: Diagnosis not present

## 2021-09-25 DIAGNOSIS — Z1239 Encounter for other screening for malignant neoplasm of breast: Secondary | ICD-10-CM | POA: Diagnosis not present

## 2021-09-25 LAB — HOUSE ACCOUNT TRACKING

## 2021-09-25 LAB — URINE CULTURE
MICRO NUMBER:: 13756428
Result:: NO GROWTH
SPECIMEN QUALITY:: ADEQUATE

## 2021-09-25 NOTE — Telephone Encounter (Signed)
Noted.  Will await results to determine if additional tx is needed

## 2021-09-25 NOTE — Telephone Encounter (Signed)
Caller name: Eldridge Dace  On DPR? :yes/no: Yes  Call back number: [phone]  Provider they see: Beverely Low  Reason for call:  Pt states that she is still waiting on her lab results from 8/9/223. Please advise

## 2021-09-25 NOTE — Telephone Encounter (Signed)
I see where a culture was ordered but not sent do we need her to return for a recollection?

## 2021-09-25 NOTE — Telephone Encounter (Signed)
Spoke w/ pt and advised that the urine was sent off but never ran . Pt is coming by to drop off urine and order is in .

## 2021-09-25 NOTE — Telephone Encounter (Signed)
Pt started taking old Cipro rx after sxs started worsening Thursday, 250 mg twice a day since Thursday and notes this has resolved sxs she is taking last available tablet tonight  Did just leave a new sample, wanted this information put in as it can change treatment if any

## 2021-09-26 ENCOUNTER — Telehealth: Payer: Self-pay

## 2021-09-26 MED ORDER — CIPROFLOXACIN HCL 500 MG PO TABS
500.0000 mg | ORAL_TABLET | Freq: Two times a day (BID) | ORAL | 0 refills | Status: AC
Start: 2021-09-26 — End: 2021-09-29

## 2021-09-26 NOTE — Telephone Encounter (Signed)
Pt informed

## 2021-09-26 NOTE — Telephone Encounter (Signed)
Discomfort, urgency, and frequency has returned since yesterday evening last cipro pill was this am.   Please advise

## 2021-09-26 NOTE — Telephone Encounter (Signed)
Despite negative urine culture, will do another 3 days of Cipro in case the UTI was present but partially treated and didn't show up on culture.  If no improvement after this round of abx, next step is a urology referral.

## 2021-09-27 LAB — URINE CULTURE
MICRO NUMBER:: 13788350
Result:: NO GROWTH
SPECIMEN QUALITY:: ADEQUATE

## 2021-09-27 NOTE — Addendum Note (Signed)
Addended by: Sheliah Hatch on: 09/27/2021 09:07 AM   Modules accepted: Orders

## 2021-09-30 ENCOUNTER — Ambulatory Visit: Payer: BC Managed Care – PPO | Admitting: Physician Assistant

## 2021-09-30 VITALS — BP 113/72 | HR 75

## 2021-09-30 DIAGNOSIS — N3281 Overactive bladder: Secondary | ICD-10-CM | POA: Diagnosis not present

## 2021-09-30 DIAGNOSIS — R351 Nocturia: Secondary | ICD-10-CM | POA: Diagnosis not present

## 2021-09-30 LAB — URINALYSIS, ROUTINE W REFLEX MICROSCOPIC
Bilirubin, UA: NEGATIVE
Glucose, UA: NEGATIVE
Ketones, UA: NEGATIVE
Leukocytes,UA: NEGATIVE
Nitrite, UA: NEGATIVE
Protein,UA: NEGATIVE
RBC, UA: NEGATIVE
Specific Gravity, UA: 1.015 (ref 1.005–1.030)
Urobilinogen, Ur: 0.2 mg/dL (ref 0.2–1.0)
pH, UA: 6.5 (ref 5.0–7.5)

## 2021-09-30 LAB — BLADDER SCAN AMB NON-IMAGING: Scan Result: 0

## 2021-09-30 MED ORDER — MIRABEGRON ER 25 MG PO TB24: 25.0000 mg | ORAL_TABLET | Freq: Every day | ORAL | 0 refills | Status: DC
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
## 2021-10-01 ENCOUNTER — Encounter: Payer: Self-pay | Admitting: *Deleted

## 2021-10-02 ENCOUNTER — Ambulatory Visit (INDEPENDENT_AMBULATORY_CARE_PROVIDER_SITE_OTHER): Payer: BC Managed Care – PPO | Admitting: Family Medicine

## 2021-10-02 ENCOUNTER — Telehealth: Payer: Self-pay

## 2021-10-02 ENCOUNTER — Other Ambulatory Visit: Payer: Self-pay

## 2021-10-02 ENCOUNTER — Ambulatory Visit: Payer: BC Managed Care – PPO | Admitting: Addiction (Substance Use Disorder)

## 2021-10-02 ENCOUNTER — Encounter: Payer: Self-pay | Admitting: Family Medicine

## 2021-10-02 VITALS — BP 110/78 | HR 64 | Temp 97.6°F | Resp 18 | Ht 66.0 in | Wt 172.6 lb

## 2021-10-02 DIAGNOSIS — E663 Overweight: Secondary | ICD-10-CM

## 2021-10-02 DIAGNOSIS — Z0000 Encounter for general adult medical examination without abnormal findings: Secondary | ICD-10-CM | POA: Diagnosis not present

## 2021-10-02 DIAGNOSIS — Z23 Encounter for immunization: Secondary | ICD-10-CM | POA: Diagnosis not present

## 2021-10-02 DIAGNOSIS — F411 Generalized anxiety disorder: Secondary | ICD-10-CM | POA: Diagnosis not present

## 2021-10-02 LAB — HEPATIC FUNCTION PANEL
ALT: 13 U/L (ref 0–35)
AST: 14 U/L (ref 0–37)
Albumin: 4.3 g/dL (ref 3.5–5.2)
Alkaline Phosphatase: 43 U/L (ref 39–117)
Bilirubin, Direct: 0.1 mg/dL (ref 0.0–0.3)
Total Bilirubin: 0.7 mg/dL (ref 0.2–1.2)
Total Protein: 6.7 g/dL (ref 6.0–8.3)

## 2021-10-02 LAB — CBC WITH DIFFERENTIAL/PLATELET
Basophils Absolute: 0 10*3/uL (ref 0.0–0.1)
Basophils Relative: 0.7 % (ref 0.0–3.0)
Eosinophils Absolute: 0.1 10*3/uL (ref 0.0–0.7)
Eosinophils Relative: 3.2 % (ref 0.0–5.0)
HCT: 39.7 % (ref 36.0–46.0)
Hemoglobin: 13.4 g/dL (ref 12.0–15.0)
Lymphocytes Relative: 24.9 % (ref 12.0–46.0)
Lymphs Abs: 1.1 10*3/uL (ref 0.7–4.0)
MCHC: 33.7 g/dL (ref 30.0–36.0)
MCV: 90.6 fl (ref 78.0–100.0)
Monocytes Absolute: 0.4 10*3/uL (ref 0.1–1.0)
Monocytes Relative: 9.8 % (ref 3.0–12.0)
Neutro Abs: 2.6 10*3/uL (ref 1.4–7.7)
Neutrophils Relative %: 61.4 % (ref 43.0–77.0)
Platelets: 352 10*3/uL (ref 150.0–400.0)
RBC: 4.38 Mil/uL (ref 3.87–5.11)
RDW: 12.9 % (ref 11.5–15.5)
WBC: 4.2 10*3/uL (ref 4.0–10.5)

## 2021-10-02 LAB — LIPID PANEL
Cholesterol: 124 mg/dL (ref 0–200)
HDL: 43.4 mg/dL (ref >39.00)
LDL Cholesterol: 67 mg/dL (ref 0–99)
NonHDL: 80.74
Total CHOL/HDL Ratio: 3
Triglycerides: 67 mg/dL (ref 0.0–149.0)
VLDL: 13.4 mg/dL (ref 0.0–40.0)

## 2021-10-02 LAB — BASIC METABOLIC PANEL
BUN: 7 mg/dL (ref 6–23)
CO2: 28 mEq/L (ref 19–32)
Calcium: 9.5 mg/dL (ref 8.4–10.5)
Chloride: 103 mEq/L (ref 96–112)
Creatinine, Ser: 0.69 mg/dL (ref 0.40–1.20)
GFR: 109.7 mL/min (ref >60.00)
Glucose, Bld: 102 mg/dL — ABNORMAL HIGH (ref 70–99)
Potassium: 4.3 mEq/L (ref 3.5–5.1)
Sodium: 139 mEq/L (ref 135–145)

## 2021-10-02 LAB — VITAMIN D 25 HYDROXY (VIT D DEFICIENCY, FRACTURES): VITD: 53.04 ng/mL (ref 30.00–100.00)

## 2021-10-02 LAB — TSH: TSH: 1.4 u[IU]/mL (ref 0.35–5.50)

## 2021-10-02 NOTE — Telephone Encounter (Signed)
LM to return call about labs

## 2021-10-02 NOTE — Progress Notes (Signed)
   Subjective:    Patient ID: Sharon [name], female    DOB: 1980/11/23, 41 y.o.   MRN: [id number]  HPI CPE- UTD on pap, Tdap.  Will get flu today.  No concerns today.  Patient Care Team    Relationship Specialty Notifications Start End  Sheliah Hatch, MD PCP - General Family Medicine  03/11/17   Olivia Mackie, MD Consulting Physician Obstetrics and Gynecology  10/02/21      Health Maintenance  Topic Date Due   INFLUENZA VACCINE  09/10/2021   COVID-19 Vaccine (5 - Pfizer risk series) 10/04/2021 (Originally 06/02/2019)   Hepatitis C Screening  10/03/2022 (Originally 10/29/2000)   PAP SMEAR-Modifier  07/24/2024   TETANUS/TDAP  03/16/2026   HIV Screening  Completed   HPV VACCINES  Aged Out      Review of Systems Patient reports no vision/ hearing changes, adenopathy,fever, weight change,  persistant/recurrent hoarseness , swallowing issues, chest pain, palpitations, edema, persistant/recurrent cough, hemoptysis, dyspnea (rest/exertional/paroxysmal nocturnal), gastrointestinal bleeding (melena, rectal bleeding), abdominal pain, significant heartburn, bowel changes, Gyn symptoms (abnormal  bleeding, pain),  syncope, focal weakness, memory loss, numbness & tingling, skin/hair/nail changes, abnormal bruising or bleeding, anxiety, or depression.     Objective:   Physical Exam General Appearance:    Alert, cooperative, no distress, appears stated age  Head:    Normocephalic, without obvious abnormality, atraumatic  Eyes:    PERRL, conjunctiva/corneas clear, EOM's intact both eyes  Ears:    Normal TM's and external ear canals, both ears  Nose:   Nares normal, septum midline, mucosa normal, no drainage    or sinus tenderness  Throat:   Lips, mucosa, and tongue normal; teeth and gums normal  Neck:   Supple, symmetrical, trachea midline, no adenopathy;    Thyroid: no enlargement/tenderness/nodules  Back:     Symmetric, no curvature, ROM normal, no CVA tenderness  Lungs:     Clear to  auscultation bilaterally, respirations unlabored  Chest Wall:    No tenderness or deformity   Heart:    Regular rate and rhythm, S1 and S2 normal, no murmur, rub   or gallop  Breast Exam:    Deferred to GYN  Abdomen:     Soft, non-tender, bowel sounds active all four quadrants,    no masses, no organomegaly  Genitalia:    Deferred to GYN  Rectal:    Extremities:   Extremities normal, atraumatic, no cyanosis or edema  Pulses:   2+ and symmetric all extremities  Skin:   Skin color, texture, turgor normal, no rashes or lesions  Lymph nodes:   Cervical, supraclavicular, and axillary nodes normal  Neurologic:   CNII-XII intact, normal strength, sensation and reflexes    throughout          Assessment & Plan:

## 2021-10-02 NOTE — Assessment & Plan Note (Signed)
BMI 27.86  Encouraged healthy diet and regular exercise.  Check labs to risk stratify.  Will follow.

## 2021-10-02 NOTE — Telephone Encounter (Signed)
-----   Message from Sheliah Hatch, MD sent at 10/02/2021  2:25 PM EDT ----- Labs look great!!  No changes at this time

## 2021-10-02 NOTE — Assessment & Plan Note (Signed)
Pt's PE WNL.  UTD on pap, Tdap.  Check labs.  Anticipatory guidance provided.  

## 2021-10-02 NOTE — Patient Instructions (Signed)
Follow up in 1 year or as needed We'll notify you of your lab results and make any changes if needed Keep up the good work on healthy diet and regular exercise- you look great! Call with any questions or concerns Stay Safe!  Stay Healthy! Happy Early Birthday!!! 

## 2021-10-02 NOTE — Progress Notes (Signed)
[facility] Counselor/Therapist Progress Note  Patient ID: Sharon [name], MRN: [id number],    Date: 10/02/2021  Time Spent:  Treatment Type: Individual Therapy  Reported Symptoms: felt like she failed  Mental Status Exam:  Appearance:   Casual     Behavior:  Appropriate and Sharing  Motor:  Normal  Speech/Language:   Clear and Coherent and Normal Rate  Affect:  Appropriate and Congruent  Mood:  anxious and sad  Thought process:  normal  Thought content:    Rumination  Sensory/Perceptual disturbances:    WNL  Orientation:  x4  Attention:  Good  Concentration:  Good  Memory:  WNL  Fund of knowledge:   Good  Insight:    Good  Judgment:   Good  Impulse Control:  Good   Risk Assessment: Danger to Self:  No Self-injurious Behavior: No Danger to Others: No Duty to Warn:no Physical Aggression / Violence:No  Access to Firearms a concern: No  Gang Involvement:No   Subjective: Client reported her husband exploding at her for not having enough childcare lined up every day all summer long, at the end of the summer. Client felt like she failed as a wife that she didn't read her hsuband's mind with what she needs. Client processed her frustration with his expectation and therapist supported client using MI & CBT to validate and process her feelings and roleplaying to help her compose how to describe this lack of a healthy expectation of her to her husband. Client prepared for ways to encourage him to be more involved with the kids' care so that she doesn't have to do most of it and become resentful. Client making progress identifying the irrational thoughts of her own or her husbands' so that she doesn't believe the lie that she has failed as a wife or mother. Client also processed her feelings about the kids growing up and no longer being at home all day all year and esp all summer. Therapist assessed for stability, and client denied SI/HI/AVH.  Interventions: Cognitive  Behavioral Therapy, Roleplay, and Motivational Interviewing   Diagnosis: No diagnosis found.   Plan of Care:   Client to return for weekly therapy with Zoila Shutter, therapist, to review again in 6 months.  Client to engage in positive self talk and challenging negative internal ruminations and self talk causing client to be overly anxious and worried using CBT, on daily practice. Client to engage in mindfulness: ie body scans each eveneing to help process and discharge emotional distress & recognize emotions. Client to utilize BSP (brainspotting) with therapist to help client regulate their anxiety in a somatic- felt body sense way: (ie by working to reduce muscle tension, ruminations, increased heart rate, constant worrying and feeling "hyper" ) by decreasing anxiety by 33% in the next 6 months.  Client to understand how to succeed in fighting back worry and embrace life's uncertainty. Client to prioritize sleep 8+ hours each week night AEB going to bed by 10pm each night.  Client agreed with the plan if there is a crisis: contact after hours office line, call 9-1-1 and/or crisis line given by therapist. Progress: Client making progress identifying the irrational thoughts of her own or her husbands' so that she doesn't believe the lie that she has failed as a wife or mother. 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  Client made progress not taking on the responsibility of  it and stressing internally about the client after processing with the therapist.  [name], LCSW, LCAS, CCTP, CCS-I, BSP

## 2021-10-15 ENCOUNTER — Telehealth: Payer: Self-pay

## 2021-10-15 NOTE — Telephone Encounter (Signed)
Received voice mail this am with patient concerns of bladder issues,  Returned call to discuss further- no answer. Left message to return call to office.

## 2021-10-16 ENCOUNTER — Ambulatory Visit: Payer: BC Managed Care – PPO | Admitting: Addiction (Substance Use Disorder)

## 2021-10-16 NOTE — Telephone Encounter (Signed)
Patient reports she has eliminated caffeine intake and urine symptoms unchanged- reports 3 days of myrbetriq 50mg  work for 3 days but back to frequent urination at night and "feeling bladder"  Discussed with , Noreene Larsson - wants to know how much water intake patient is having prior to bedtime and may need to see MD for possibly cysto Returned call to patient to discuss- no answer. Left message to return call to office.

## 2021-10-18 NOTE — Telephone Encounter (Signed)
Made patient aware to continued taking Myrbetriq for a few more weeks. Made patient aware that Myrbetriq could possible take up to 3 months to reach full maximum benefit. Made patient aware that if her symptom haven't improve in a few week Noreene Larsson is willing to prescript  patient a new OAB medication. Patient voiced she is not getting any sleep and advised patient to follow up with PCP regarding her concern about sleep. Patient voiced understanding

## 2021-10-18 NOTE — Telephone Encounter (Signed)
Patient states that she is only taking in about 12 oz two hours prior to bedtime.  Please advise, the best time to reach her is between 12-2pm today.

## 2021-10-22 ENCOUNTER — Telehealth: Payer: BC Managed Care – PPO | Admitting: Physician Assistant

## 2021-10-22 DIAGNOSIS — R3989 Other symptoms and signs involving the genitourinary system: Secondary | ICD-10-CM

## 2021-10-22 DIAGNOSIS — N9489 Other specified conditions associated with female genital organs and menstrual cycle: Secondary | ICD-10-CM | POA: Diagnosis not present

## 2021-10-22 NOTE — Progress Notes (Signed)
Because of possible urethritis and continued urgency despite start of medication for OABN, I feel your condition warrants further evaluation and I recommend that you be seen in a face to face visit.   NOTE: There will be NO CHARGE for this eVisit   If you are having a true medical emergency please call 911.      For an urgent face to face visit, [place] has seven urgent care centers for your convenience:     [facility] Health Urgent Care Center at [facility] Directions [phone] [address] [place], [place] [address]    [facility] Health Urgent Care Center [facility]) Get Driving Directions [phone] [address] [place], [place] [address]  [facility] Health Urgent Care Center [facility]) Get Driving Directions [phone] [address] [place],  [place]  [address]  [facility] Health Urgent Care Center [facility] at [employer] Directions  [phone] [place],  [place] [address]   [facility] Health Urgent Care at [facility] Get Driving Directions [phone] [address] [place], [place] [address]   [facility] Health Urgent Care at [facility] Get Driving Directions  [phone] [address] [place], [place] [address]   [facility] Health Urgent Care at [facility] Directions [phone] [address] [place], [place] [address]  Your MyChart E-visit questionnaire answers were reviewed by a board certified advanced clinical practitioner to complete your personal care plan based on your specific symptoms.  Thank you for using e-Visits.

## 2021-10-23 ENCOUNTER — Telehealth: Payer: Self-pay | Admitting: Family Medicine

## 2021-10-23 NOTE — Telephone Encounter (Signed)
Initial Comment Requests appt // Symptoms- started new medication, urge to pee increased at night, vaginal burning, swollen, discomfort when urinating GOTO Facility Not Listed Nearby UC Translation No Nurse Assessment Nurse: Stephanie Acre, RN, [place] Date/Time (Eastern Time): 10/22/2021 6:34:53 PM Confirm and document reason for call. If symptomatic, describe symptoms. ---Caller states that she started new medicationmybetriq 50 mg. States that she has urge to urinateincreased at night- not new symptom and not improved with med, vaginal burning, swollen, discomfort when urinating. No fever. On med for 2 weeks. Does the patient have any new or worsening symptoms? ---Yes Will a triage be completed? ---Yes Related visit to physician within the last 2 weeks? ---No Does the PT have any chronic conditions? (i.e. diabetes, asthma, this includes High risk factors for pregnancy, etc.) ---No Is the patient pregnant or possibly pregnant? (Ask all females between the ages of 62-55) ---No Is this a behavioral health or substance abuse call? ---No Guidelines Guideline Title Affirmed Question Affirmed Notes Nurse Date/Time Lamount Cohen Time) Urination Pain - Female Side (flank) or lower back pain present Stephanie Acre, RN, Zollie Scale 10/22/2021 6:38:42 PM PLEASE NOTE: All timestamps contained within this report are represented as [place] Standard Time. CONFIDENTIALTY NOTICE: This fax transmission is intended only for the addressee. It contains information that is legally privileged, confidential or otherwise protected from use or disclosure. If you are not the intended recipient, you are strictly prohibited from reviewing, disclosing, copying using or disseminating any of this information or taking any action in reliance on or regarding this information. If you have received this fax in error, please notify us immediately by telephone so that we can arrange for its return to [name]. Phone: [phone], Toll-Free:  [phone], Fax: [phone] Page: 2 of 2 Call Id: [id number] Disp. Time Lamount Cohen Time) Disposition Final User 10/22/2021 6:43:35 PM See HCP within 4 Hours (or PCP triage) Yes Stephanie Acre, RN, Zollie Scale Final Disposition 10/22/2021 6:43:35 PM See HCP within 4 Hours (or PCP triage) Yes [place], RN, Zollie Scale Caller Disagree/Comply Comply Caller Understands Yes PreDisposition Home Care Care Advice Given Per Guideline SEE HCP (OR PCP TRIAGE) WITHIN 4 HOURS: * IF OFFICE WILL BE CLOSED AND PCP SECOND-LEVEL TRIAGE REQUIRED: You may need to be seen. Your doctor (or NP/PA) will want to talk with you to decide what's best. I'll page the oncall provider now. If you haven't heard from the provider (or me) within 30 minutes, call again. NOTE: If on-call provider can't be reached, send to [facility] or ED. PAIN MEDICINES: * For pain relief, you can take either acetaminophen, ibuprofen, or naproxen. * They are over-the-counter (OTC) pain drugs. You can buy them at the drugstore. * Before taking any medicine, read all the instructions on the package. CALL BACK IF: * You become worse CARE ADVICE given per Urination Pain - Female (Adult) guideline. Comments User: Carlota Raspberry, RN Date/Time Lamount Cohen Time): 10/22/2021 6:37:43 PM states that she called urologist office last week when no improvement, no orders at that time Referrals GO TO FACILITY OTHER - SPECIFY

## 2021-10-23 NOTE — Telephone Encounter (Signed)
Yeast can definitely cause swelling and discomfort.  If she is being treated for yeast she can continue to [facility] and see if sxs improve once yeast resolves.  If she feels sxs are not improving or worsening, she needs to stop Myrbetriq and schedule w/ urology

## 2021-10-23 NOTE — Telephone Encounter (Signed)
Called pt and informed

## 2021-10-23 NOTE — Telephone Encounter (Signed)
Pt started mybetrique and is having issues with increased urination and swelling should pt stop medication at this time?

## 2021-10-23 NOTE — Telephone Encounter (Signed)
Pt reports went to urgent care last night and was treated for yeast notes it was not a UTI spoke with Urology and they advised continuing Myrbetrique and now pt is confused as to what she should do

## 2021-10-23 NOTE — Telephone Encounter (Signed)
Stop medication and call Urologist

## 2021-10-23 NOTE — Telephone Encounter (Signed)
Advised 

## 2021-10-29 ENCOUNTER — Telehealth: Payer: Self-pay

## 2021-10-29 NOTE — Telephone Encounter (Signed)
Patient called to request a rx for Myrbetriq.  She states the medication has worked well for her, please send to [facility] (in chart).

## 2021-10-30 ENCOUNTER — Ambulatory Visit (INDEPENDENT_AMBULATORY_CARE_PROVIDER_SITE_OTHER): Payer: BC Managed Care – PPO | Admitting: Addiction (Substance Use Disorder)

## 2021-10-30 ENCOUNTER — Other Ambulatory Visit: Payer: Self-pay | Admitting: Physician Assistant

## 2021-10-30 DIAGNOSIS — F39 Unspecified mood [affective] disorder: Secondary | ICD-10-CM | POA: Diagnosis not present

## 2021-10-30 DIAGNOSIS — F428 Other obsessive-compulsive disorder: Secondary | ICD-10-CM | POA: Diagnosis not present

## 2021-10-30 DIAGNOSIS — F411 Generalized anxiety disorder: Secondary | ICD-10-CM

## 2021-10-30 MED ORDER — MIRABEGRON ER 25 MG PO TB24: 25.0000 mg | ORAL_TABLET | Freq: Every day | ORAL | 3 refills | Status: DC

## 2021-10-30 NOTE — Progress Notes (Signed)
[facility] Counselor/Therapist Progress Note  Patient ID: Sharon [name], MRN: [id number],    Date: 10/30/2021  Time Spent: 67mins  Treatment Type: Individual Therapy  Reported Symptoms: ruminations, anxiety, hopelesness  Mental Status Exam:  Appearance:   Casual and Well Groomed     Behavior:  Appropriate and Sharing  Motor:  Normal  Speech/Language:   Clear and Coherent and Normal Rate  Affect:  Appropriate and Congruent  Mood:  anxious, labile, and sad  Thought process:  normal  Thought content:    Rumination  Sensory/Perceptual disturbances:    WNL  Orientation:  x4  Attention:  Good  Concentration:  Good  Memory:  WNL  Fund of knowledge:   Good  Insight:    Fair  Judgment:   Good  Impulse Control:  Good   Risk Assessment: Danger to Self:  No Self-injurious Behavior: No Danger to Others: No Duty to Warn:no Physical Aggression / Violence:No  Access to Firearms a concern: No  Gang Involvement:No   Subjective: Client reported a lot of increased anxiety and fears of not doing enough to help her sister who has lost all hope in her faith and to live with her depression and also to help her [employer] client who always feels abandoned by God. Client processed through her struggling with her faith of Gods goodness and his pursuit of her sister and client and her emotions surrounding those fears/questions/ thoughts. Therapist used MI & CBT with client to help her process those thoughts and emottions and to help validate the range of emotions she is dealing with right now: anxiety, sadness, and hopelessness for their situations. Client also processed how drained she continues to feel from [place] felt [place] last church and how that is making her more susceptible to her mood shifts of emotions in this season still. Therapist normailized this and also used SFT to help client think through ways to rid herself of the hopeless thoughts and ways to fill her cup back  up.  Therapist assessed for stability, and client denied SI/HI/AVH.  Interventions: Cognitive Behavioral Therapy, Motivational Interviewing, and Solution-Oriented/Positive Psychology   Diagnosis:   ICD-10-CM   1. Generalized anxiety disorder  F41.1     2. Episodic mood disorder (HCC)  F39     3. Obsessional thoughts  F42.8      Plan of Care:   Client to return for weekly therapy with Sammuel Cooper, therapist, to review again in 6 months.  Client to engage in positive self talk and challenging negative internal ruminations and self talk causing client to be overly anxious and worried using CBT, on daily practice. Client to engage in mindfulness: ie body scans each eveneing to help process and discharge emotional distress & recognize emotions. Client to utilize BSP (brainspotting) with therapist to help client regulate their anxiety in a somatic- felt body sense way: (ie by working to reduce muscle tension, ruminations, increased heart rate, constant worrying and feeling "hyper" ) by decreasing anxiety by 33% in the next 6 months.  Client to understand how to succeed in fighting back worry and embrace life's uncertainty. Client to prioritize sleep 8+ hours each week night AEB going to bed by 10pm each night.  Client agreed with the plan if there is a crisis: contact after hours office line, call 9-1-1 and/or crisis line given by therapist. Progress: Client making progress identifying the irrational thoughts of her own or her husbands' so that she  doesn't believe the lie that she has failed as a wife or mother. 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 Client continuing to struggle with feeling like all she is doing is not enough to keep people safe, but is always realizing those are irrational thoughts.  Client made progress not taking on the responsibility of it and stressing internally about the client after  processing with the therapist.  [name], LCSW, LCAS, CCTP, CCS-I, BSP

## 2021-10-30 NOTE — Telephone Encounter (Signed)
Made patient aware that prescription for Myrbetriq was sent to her pharmacy. Patient voiced understanding.

## 2021-11-06 ENCOUNTER — Ambulatory Visit: Payer: BC Managed Care – PPO | Admitting: Addiction (Substance Use Disorder)

## 2021-11-13 ENCOUNTER — Ambulatory Visit: Payer: BC Managed Care – PPO | Admitting: Addiction (Substance Use Disorder)

## 2021-11-13 DIAGNOSIS — F411 Generalized anxiety disorder: Secondary | ICD-10-CM

## 2021-11-13 NOTE — Progress Notes (Signed)
[facility] Counselor/Therapist Progress Note  Patient ID: Sharon [name], MRN: [id number],    Date: 11/13/2021  Time Spent: 66mins  Treatment Type: Individual Therapy  Reported Symptoms: hopeful, dreaming  Mental Status Exam:  Appearance:   Casual and Well Groomed     Behavior:  Appropriate and Sharing  Motor:  Normal  Speech/Language:   Clear and Coherent and Normal Rate  Affect:  Appropriate and Congruent  Mood:  sad and also hopeful  Thought process:  normal  Thought content:    Rumination  Sensory/Perceptual disturbances:    WNL  Orientation:  x4  Attention:  [name]  Concentration:  [name]  Memory:  WNL  Fund of knowledge:   [name]  Insight:    Fair  Judgment:   [name]  Impulse Control:  [name]   Risk Assessment: Danger to Self:  No Self-injurious Behavior: No Danger to Others: No Duty to Warn:no Physical Aggression / Violence:No  Access to Firearms a concern: No  Gang Involvement:No   Subjective: Client reported feeling hopeful about the future but sad and emotional its not her season for her dream job yet. 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 Client tearfully processed her realization that it is not yet her season to be able to move there with her family, but is trying to stay hopeful and yet also still content in "where God has her". Client made progress holding hope for future while also finding hope in the joy of the present season. Therapist assessed for stability, and client denied SI/HI/AVH.  Interventions: Cognitive Behavioral Therapy and Motivational Interviewing   Diagnosis: No diagnosis found.   Plan of Care:   Client to return for  weekly therapy with Sammuel Cooper, therapist, to review again in 6 months.  Client to engage in positive self talk and challenging negative internal ruminations and self talk causing client to be overly anxious and worried using CBT, on daily practice. Client to engage in mindfulness: ie body scans each eveneing to help process and discharge emotional distress & recognize emotions. Client to utilize BSP (brainspotting) with therapist to help client regulate their anxiety in a somatic- felt body sense way: (ie by working to reduce muscle tension, ruminations, increased heart rate, constant worrying and feeling "hyper" ) by decreasing anxiety by 33% in the next 6 months.  Client to understand how to succeed in fighting back worry and embrace life's uncertainty. Client to prioritize sleep 8+ hours each week night AEB going to bed by 10pm each night.  Client agreed with the plan if there is a crisis: contact after hours office line, call 9-1-1 and/or crisis line given by therapist. Progress: Client making progress identifying the irrational thoughts of her own or her husbands' so that she doesn't believe the lie that she has failed as a wife or mother. 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 Client continuing to struggle with feeling like all she is doing is not enough to keep people safe, but is always realizing those are irrational thoughts.  Client made progress not taking on  the responsibility of it and stressing internally about the client after processing with the therapist.  Sharon Good, LCSW, LCAS, CCTP, CCS-I, BSP

## 2021-11-19 ENCOUNTER — Telehealth: Payer: Self-pay

## 2021-11-19 NOTE — Telephone Encounter (Signed)
Tried calling patient with no answer and left voiced message for patient to call office back. Patient called about medication being the wrong dosage.

## 2021-11-20 ENCOUNTER — Ambulatory Visit: Payer: BC Managed Care – PPO | Admitting: Addiction (Substance Use Disorder)

## 2021-11-20 ENCOUNTER — Telehealth: Payer: Self-pay

## 2021-11-20 DIAGNOSIS — F411 Generalized anxiety disorder: Secondary | ICD-10-CM | POA: Diagnosis not present

## 2021-11-20 DIAGNOSIS — F428 Other obsessive-compulsive disorder: Secondary | ICD-10-CM | POA: Diagnosis not present

## 2021-11-20 MED ORDER — MIRABEGRON ER 50 MG PO TB24: 50.0000 mg | ORAL_TABLET | Freq: Every day | ORAL | 3 refills | Status: DC

## 2021-11-20 NOTE — Telephone Encounter (Signed)
Received call from patient that she needs a new prescription for 50mg  myrbetriq to be sent to pharmacy as discussed with provider at last visit.   Prescription sent to pharmacy. Patient voiced understanding.

## 2021-11-20 NOTE — Progress Notes (Signed)
[facility] Counselor/Therapist Progress Note  Patient ID: Sharon [name], MRN: [id number],    Date: 11/21/2021  Time Spent:  Treatment Type: Individual Therapy  Reported Symptoms: sad, anxious, intrusive judgmental self thoughts  Mental Status Exam:  Appearance:   Casual and Well Groomed     Behavior:  Appropriate and Sharing  Motor:  Normal  Speech/Language:   Clear and Coherent and Normal Rate  Affect:  Appropriate and Congruent  Mood:  anxious and sad  Thought process:  normal  Thought content:    Rumination  Sensory/Perceptual disturbances:    WNL  Orientation:  x4  Attention:  Good  Concentration:  Good  Memory:  WNL  Fund of knowledge:   Good  Insight:    Good  Judgment:   Good  Impulse Control:  Good   Risk Assessment: Danger to Self:  No Self-injurious Behavior: No Danger to Others: No Duty to Warn:no Physical Aggression / Violence:No  Access to Firearms a concern: No  Gang Involvement:No   Subjective: Client reported feeling heavy and sad about her daughter who is having severe anxiety and is having to see a therapist but recently worsened and began to stop eating her food. Client reported she is having to bring her daughter to the pediatrician to get her on anxiety meds and this is making her feel like a failure of a mom and a therapist. Client reported having intrusive thoughts about mean comments her family may say after they judge her, due to past experiences of their lack of empathy and common judgement. Therapist used MI & CBT with client to validate her feelings and challenge the intrusive thought with truth that she is a good mom by getting her daughter the help she needs and surrendering to other professionals when she as a professional therapist has taught her all the emotion regulation techniques she can. Client also grieving her inability to receive compassionate support from her mom or sisters (family) about anything she is struggling with,  without them making it about them (due to their personality disorder behaviors). Thearpist normalized client's feelings and helped her process the grief of not having supportive family members she can come to. Therapist assessed for stability, and client denied SI/HI/AVH.  Interventions: Cognitive Behavioral Therapy and Motivational Interviewing   Diagnosis:   ICD-10-CM   1. Generalized anxiety disorder  F41.1     2. Obsessional thoughts  F42.8       Plan of Care:   Client to return for weekly therapy with Zoila Shutter, therapist, to review again in 6 months.  Client to engage in positive self talk and challenging negative internal ruminations and self talk causing client to be overly anxious and worried using CBT, on daily practice. Client to engage in mindfulness: ie body scans each eveneing to help process and discharge emotional distress & recognize emotions. Client to utilize BSP (brainspotting) with therapist to help client regulate their anxiety in a somatic- felt body sense way: (ie by working to reduce muscle tension, ruminations, increased heart rate, constant worrying and feeling "hyper" ) by decreasing anxiety by 33% in the next 6 months.  Client to understand how to succeed in fighting back worry and embrace life's uncertainty. Client to prioritize sleep 8+ hours each week night AEB going to bed by 10pm each night.  Client agreed with the plan if there is a crisis: contact after hours office line, call 9-1-1 and/or crisis line given by therapist. Progress: Client making  progress identifying the irrational thoughts of her own or her husbands' so that she doesn't believe the lie that she has failed as a wife or mother. 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 Client continuing to struggle with feeling like all she is doing is not enough to keep people safe, but is always realizing those are  irrational thoughts.  Client made progress not taking on the responsibility of it and stressing internally about the client after processing with the therapist.  Barnie Del, LCSW, LCAS, CCTP, CCS-I, BSP

## 2021-11-27 DIAGNOSIS — F331 Major depressive disorder, recurrent, moderate: Secondary | ICD-10-CM | POA: Diagnosis not present

## 2021-12-04 ENCOUNTER — Telehealth: Payer: Self-pay

## 2021-12-04 ENCOUNTER — Ambulatory Visit: Payer: BC Managed Care – PPO | Admitting: Physician Assistant

## 2021-12-04 DIAGNOSIS — R351 Nocturia: Secondary | ICD-10-CM

## 2021-12-04 NOTE — Telephone Encounter (Signed)
.  Referral sent and pt is aware via mychart message [facility]

## 2021-12-04 NOTE — Telephone Encounter (Signed)
Patient requested new referral to urology as patient reports prefers [facility]

## 2021-12-06 ENCOUNTER — Ambulatory Visit (INDEPENDENT_AMBULATORY_CARE_PROVIDER_SITE_OTHER): Payer: BC Managed Care – PPO | Admitting: Family Medicine

## 2021-12-06 ENCOUNTER — Encounter: Payer: Self-pay | Admitting: Family Medicine

## 2021-12-06 VITALS — BP 120/76 | HR 59 | Temp 97.8°F | Resp 17 | Ht 66.0 in | Wt 173.5 lb

## 2021-12-06 DIAGNOSIS — F32A Depression, unspecified: Secondary | ICD-10-CM

## 2021-12-06 DIAGNOSIS — F419 Anxiety disorder, unspecified: Secondary | ICD-10-CM

## 2021-12-06 MED ORDER — DESVENLAFAXINE SUCCINATE ER 25 MG PO TB24: 25.0000 mg | ORAL_TABLET | Freq: Every day | ORAL | 3 refills | Status: DC

## 2021-12-06 NOTE — Assessment & Plan Note (Signed)
Deteriorated.  Pt is now willing to start medication to tx her rising anxiety and worsening depression.  She is interested in trying Pristiq after a friend and client stated they were having success.  Will hold on starting Clonidine at this time but if sxs don't improve we can add at a future visit.  Pt expressed understanding and is in agreement w/ plan.

## 2021-12-06 NOTE — Addendum Note (Signed)
Addended byPauline Good, Kinta Martis on: 12/06/2021 01:48 PM   Modules accepted: Orders

## 2021-12-06 NOTE — Progress Notes (Signed)
   Subjective:    Patient ID: Sharon [name], female    DOB: [birth year], 41 y.o.   MRN: [id number]  HPI Anxiety/depression- pt has been considering taking medication for her mood.  Friend and client are taking Pristiq and like it.  Pt has had issues w/ SSRIs and weight gain in the past.  Continues to have issues sleeping.  Pt is wondering about clonidine for anxiety and sleep.  Behavioral issues w/ daughter remain and are putting a strain on the whole family.   Review of Systems For ROS see HPI     Objective:   Physical Exam Vitals reviewed.  Constitutional:      General: She is not in acute distress.    Appearance: Normal appearance. She is not ill-appearing.  HENT:     Head: Normocephalic and atraumatic.  Eyes:     Extraocular Movements: Extraocular movements intact.     Conjunctiva/sclera: Conjunctivae normal.     Pupils: Pupils are equal, round, and reactive to light.  Skin:    General: Skin is warm and dry.  Neurological:     General: No focal deficit present.     Mental Status: She is alert and oriented to person, place, and time.  Psychiatric:        Mood and Affect: Mood normal.        Behavior: Behavior normal.        Thought Content: Thought content normal.           Assessment & Plan:

## 2021-12-06 NOTE — Patient Instructions (Signed)
Follow up in 3-4 weeks to recheck mood START the Pristiq (Desvenlafaxine) daily Call with any questions or concerns Hang in there!!!

## 2021-12-11 ENCOUNTER — Ambulatory Visit: Payer: BC Managed Care – PPO | Admitting: Addiction (Substance Use Disorder)

## 2021-12-11 DIAGNOSIS — F331 Major depressive disorder, recurrent, moderate: Secondary | ICD-10-CM | POA: Diagnosis not present

## 2021-12-18 DIAGNOSIS — F331 Major depressive disorder, recurrent, moderate: Secondary | ICD-10-CM | POA: Diagnosis not present

## 2021-12-25 ENCOUNTER — Ambulatory Visit (INDEPENDENT_AMBULATORY_CARE_PROVIDER_SITE_OTHER): Payer: BC Managed Care – PPO | Admitting: Addiction (Substance Use Disorder)

## 2021-12-25 DIAGNOSIS — F331 Major depressive disorder, recurrent, moderate: Secondary | ICD-10-CM | POA: Diagnosis not present

## 2022-01-01 ENCOUNTER — Ambulatory Visit: Payer: BC Managed Care – PPO | Admitting: Family Medicine

## 2022-01-08 ENCOUNTER — Ambulatory Visit: Payer: BC Managed Care – PPO | Admitting: Family Medicine

## 2022-01-08 ENCOUNTER — Ambulatory Visit (HOSPITAL_BASED_OUTPATIENT_CLINIC_OR_DEPARTMENT_OTHER)
Admission: RE | Admit: 2022-01-08 | Discharge: 2022-01-08 | Disposition: A | Discharge status: Home / Self Care | Payer: BC Managed Care – PPO | Source: Ambulatory Visit | Attending: Family Medicine | Admitting: Family Medicine

## 2022-01-08 ENCOUNTER — Encounter: Payer: Self-pay | Admitting: Family Medicine

## 2022-01-08 VITALS — BP 130/74 | HR 76 | Temp 98.5°F | Resp 18 | Ht 66.0 in | Wt 176.2 lb

## 2022-01-08 DIAGNOSIS — M25552 Pain in left hip: Secondary | ICD-10-CM | POA: Insufficient documentation

## 2022-01-08 MED ORDER — PREDNISONE 10 MG PO TABS: ORAL_TABLET | ORAL | 0 refills | Status: DC

## 2022-01-08 MED ORDER — METHOCARBAMOL 500 MG PO TABS: 500.0000 mg | ORAL_TABLET | Freq: Three times a day (TID) | ORAL | 0 refills | Status: DC | PRN

## 2022-01-08 NOTE — Progress Notes (Signed)
   Subjective:    Patient ID: Sharon [name], female    DOB: 23-Jun-1980, 41 y.o.   MRN: [id number]  HPI L hip pain- sxs started ~2 weeks ago.  ~1 month ago had LBP for a few days that moved into L hip.  No relief w/ ibuprofen.  Does not hurt w/ exercising.  Pain will radiate down to ankle.  Painful to sit or stand or lie down.  Pain can migrate to hamstring or calf and then it is a throbbing pain.  Over Thanksgiving, the first 10 seconds upon standing were excruciating and pain was 10/10.  No known injury.  No groin pain.  Standing worsens pain but nothing improves pain.   Review of Systems For ROS see HPI     Objective:   Physical Exam Vitals reviewed.  Constitutional:      General: She is not in acute distress.    Appearance: Normal appearance. She is not ill-appearing.  Musculoskeletal:        General: Tenderness (TTP over L greater trochanteric bursa) present. No swelling, deformity or signs of injury.  Skin:    General: Skin is warm and dry.  Neurological:     General: No focal deficit present.     Mental Status: She is alert and oriented to person, place, and time.  Psychiatric:     Comments: Anxious, tearful when talking about her sister           Assessment & Plan:   L hip pain- new.  Acute.  Sxs started ~2 weeks ago.  Prior to that she had LBP that seemed to move into L hip.  Pain will radiate down into ankle.  Able to exercise w/o difficulty but will subsequently have pain.  Worse w/ standing but nothing improves pain.  Given radiating nature of pain, will start Prednisone taper.  Will start Robaxin to help w/ spasm and sleep.  Get Xray to assess hip as sister was recently dx'd w/ metastatic breast cancer to her hip.  Pt appreciative.  If no improvement w/ Prednisone, will need Ortho referral.  Pt expressed understanding and is in agreement w/ plan.

## 2022-01-08 NOTE — Patient Instructions (Signed)
Go to [facility] to get your xray START the Prednisone as directed- take w/ food Use the Methocarbamol as needed for pain/spasm ICE Gentle stretching Call with any questions or concerns Hang in there!!!

## 2022-01-10 DIAGNOSIS — R350 Frequency of micturition: Secondary | ICD-10-CM | POA: Diagnosis not present

## 2022-01-10 DIAGNOSIS — R8271 Bacteriuria: Secondary | ICD-10-CM | POA: Diagnosis not present

## 2022-01-10 DIAGNOSIS — R351 Nocturia: Secondary | ICD-10-CM | POA: Diagnosis not present

## 2022-01-14 ENCOUNTER — Other Ambulatory Visit: Payer: Self-pay

## 2022-01-14 ENCOUNTER — Encounter: Payer: Self-pay | Admitting: Family Medicine

## 2022-01-14 DIAGNOSIS — M25552 Pain in left hip: Secondary | ICD-10-CM

## 2022-01-14 NOTE — Telephone Encounter (Signed)
Referral has been placed. 

## 2022-01-14 NOTE — Telephone Encounter (Signed)
I have sent this message to Dr Beverely Low for further instructions and I have left the pt a VM stating I have sent this to her

## 2022-01-15 DIAGNOSIS — F331 Major depressive disorder, recurrent, moderate: Secondary | ICD-10-CM | POA: Diagnosis not present

## 2022-01-22 DIAGNOSIS — F331 Major depressive disorder, recurrent, moderate: Secondary | ICD-10-CM | POA: Diagnosis not present

## 2022-02-12 DIAGNOSIS — F331 Major depressive disorder, recurrent, moderate: Secondary | ICD-10-CM | POA: Diagnosis not present

## 2022-02-19 DIAGNOSIS — F331 Major depressive disorder, recurrent, moderate: Secondary | ICD-10-CM | POA: Diagnosis not present

## 2022-02-20 DIAGNOSIS — M25552 Pain in left hip: Secondary | ICD-10-CM | POA: Diagnosis not present

## 2022-02-20 DIAGNOSIS — R351 Nocturia: Secondary | ICD-10-CM | POA: Diagnosis not present

## 2022-02-20 DIAGNOSIS — R278 Other lack of coordination: Secondary | ICD-10-CM | POA: Diagnosis not present

## 2022-02-20 DIAGNOSIS — R350 Frequency of micturition: Secondary | ICD-10-CM | POA: Diagnosis not present

## 2022-02-26 DIAGNOSIS — F331 Major depressive disorder, recurrent, moderate: Secondary | ICD-10-CM | POA: Diagnosis not present

## 2022-03-03 ENCOUNTER — Telehealth: Payer: Self-pay | Admitting: Family Medicine

## 2022-03-03 NOTE — Telephone Encounter (Addendum)
Caller name: Sharon [name]  On DPR?: Yes  Call back number: [phone] (mobile)  Provider they see: Midge Minium, MD  Reason for call: Follow up on last visit Prisitiq not being filled approved by insurance? Pt states insurance will not pay for Prisitiq is there something else Dr Birdie Riddle can prescribe?

## 2022-03-04 ENCOUNTER — Encounter: Payer: Self-pay | Admitting: Family Medicine

## 2022-03-04 NOTE — Telephone Encounter (Signed)
Can we initiate the prior authorization as this has been working well for pt and switching may be detrimental?  Please and thank you

## 2022-03-04 NOTE — Telephone Encounter (Signed)
Needs generic SSRI or SNRI the pristique is not covered pt is asking for an alternative unless you wish for [name] to proceed with PA   Please advise

## 2022-03-04 NOTE — Telephone Encounter (Signed)
Can we submit a PA for the generic for Pristiq  25 mg

## 2022-03-04 NOTE — Telephone Encounter (Signed)
Desvenlafaxine is the generic for Pristiq (since 2017) and should be covered by insurance.  Please re-send as the generic (same dose, same quantity, etc)

## 2022-03-04 NOTE — Telephone Encounter (Signed)
Tried as generic still not covered

## 2022-03-07 NOTE — Telephone Encounter (Addendum)
Checked with pharmacy, they state no PA required

## 2022-03-12 DIAGNOSIS — F331 Major depressive disorder, recurrent, moderate: Secondary | ICD-10-CM | POA: Diagnosis not present

## 2022-03-14 ENCOUNTER — Other Ambulatory Visit (HOSPITAL_COMMUNITY): Payer: Self-pay

## 2022-03-14 NOTE — Telephone Encounter (Signed)
Ran test claim, received paid claim.   Called pharmacy, they also received paid claim. No pa needed

## 2022-03-14 NOTE — Telephone Encounter (Signed)
Lvm for patient letting her know this info, invited her to call back if she needed anything else

## 2022-03-19 DIAGNOSIS — F331 Major depressive disorder, recurrent, moderate: Secondary | ICD-10-CM | POA: Diagnosis not present

## 2022-03-26 DIAGNOSIS — F331 Major depressive disorder, recurrent, moderate: Secondary | ICD-10-CM | POA: Diagnosis not present

## 2022-03-27 DIAGNOSIS — M25552 Pain in left hip: Secondary | ICD-10-CM | POA: Diagnosis not present

## 2022-03-27 DIAGNOSIS — R350 Frequency of micturition: Secondary | ICD-10-CM | POA: Diagnosis not present

## 2022-03-27 DIAGNOSIS — R278 Other lack of coordination: Secondary | ICD-10-CM | POA: Diagnosis not present

## 2022-04-09 DIAGNOSIS — R278 Other lack of coordination: Secondary | ICD-10-CM | POA: Diagnosis not present

## 2022-04-09 DIAGNOSIS — R350 Frequency of micturition: Secondary | ICD-10-CM | POA: Diagnosis not present

## 2022-04-09 DIAGNOSIS — F331 Major depressive disorder, recurrent, moderate: Secondary | ICD-10-CM | POA: Diagnosis not present

## 2022-04-09 DIAGNOSIS — M6281 Muscle weakness (generalized): Secondary | ICD-10-CM | POA: Diagnosis not present

## 2022-04-16 DIAGNOSIS — F331 Major depressive disorder, recurrent, moderate: Secondary | ICD-10-CM | POA: Diagnosis not present

## 2022-04-21 ENCOUNTER — Encounter: Payer: Self-pay | Admitting: Addiction (Substance Use Disorder)

## 2022-04-23 DIAGNOSIS — F331 Major depressive disorder, recurrent, moderate: Secondary | ICD-10-CM | POA: Diagnosis not present

## 2022-04-24 DIAGNOSIS — R9230 Dense breasts, unspecified: Secondary | ICD-10-CM | POA: Diagnosis not present

## 2022-04-24 DIAGNOSIS — R92343 Mammographic extreme density, bilateral breasts: Secondary | ICD-10-CM | POA: Diagnosis not present

## 2022-04-24 DIAGNOSIS — Z803 Family history of malignant neoplasm of breast: Secondary | ICD-10-CM | POA: Diagnosis not present

## 2022-04-24 DIAGNOSIS — Z1231 Encounter for screening mammogram for malignant neoplasm of breast: Secondary | ICD-10-CM | POA: Diagnosis not present

## 2022-04-24 DIAGNOSIS — Z1239 Encounter for other screening for malignant neoplasm of breast: Secondary | ICD-10-CM | POA: Diagnosis not present

## 2022-04-30 DIAGNOSIS — H00014 Hordeolum externum left upper eyelid: Secondary | ICD-10-CM | POA: Diagnosis not present

## 2022-05-14 DIAGNOSIS — F331 Major depressive disorder, recurrent, moderate: Secondary | ICD-10-CM | POA: Diagnosis not present

## 2022-05-21 DIAGNOSIS — F331 Major depressive disorder, recurrent, moderate: Secondary | ICD-10-CM | POA: Diagnosis not present

## 2022-05-22 DIAGNOSIS — R278 Other lack of coordination: Secondary | ICD-10-CM | POA: Diagnosis not present

## 2022-05-22 DIAGNOSIS — R350 Frequency of micturition: Secondary | ICD-10-CM | POA: Diagnosis not present

## 2022-05-22 DIAGNOSIS — M6281 Muscle weakness (generalized): Secondary | ICD-10-CM | POA: Diagnosis not present

## 2022-06-04 DIAGNOSIS — F331 Major depressive disorder, recurrent, moderate: Secondary | ICD-10-CM | POA: Diagnosis not present

## 2022-06-11 DIAGNOSIS — F331 Major depressive disorder, recurrent, moderate: Secondary | ICD-10-CM | POA: Diagnosis not present

## 2022-06-25 DIAGNOSIS — F331 Major depressive disorder, recurrent, moderate: Secondary | ICD-10-CM | POA: Diagnosis not present

## 2022-06-26 DIAGNOSIS — Z124 Encounter for screening for malignant neoplasm of cervix: Secondary | ICD-10-CM | POA: Diagnosis not present

## 2022-06-26 DIAGNOSIS — Z01419 Encounter for gynecological examination (general) (routine) without abnormal findings: Secondary | ICD-10-CM | POA: Diagnosis not present

## 2022-06-26 DIAGNOSIS — Z01411 Encounter for gynecological examination (general) (routine) with abnormal findings: Secondary | ICD-10-CM | POA: Diagnosis not present

## 2022-07-09 DIAGNOSIS — F331 Major depressive disorder, recurrent, moderate: Secondary | ICD-10-CM | POA: Diagnosis not present

## 2022-07-16 DIAGNOSIS — F331 Major depressive disorder, recurrent, moderate: Secondary | ICD-10-CM | POA: Diagnosis not present

## 2022-07-24 DIAGNOSIS — F331 Major depressive disorder, recurrent, moderate: Secondary | ICD-10-CM | POA: Diagnosis not present

## 2022-07-31 ENCOUNTER — Other Ambulatory Visit: Payer: Self-pay | Admitting: Family Medicine

## 2022-08-13 DIAGNOSIS — F331 Major depressive disorder, recurrent, moderate: Secondary | ICD-10-CM | POA: Diagnosis not present

## 2022-08-13 DIAGNOSIS — Z30430 Encounter for insertion of intrauterine contraceptive device: Secondary | ICD-10-CM | POA: Diagnosis not present

## 2022-08-25 DIAGNOSIS — Z9189 Other specified personal risk factors, not elsewhere classified: Secondary | ICD-10-CM | POA: Diagnosis not present

## 2022-08-25 DIAGNOSIS — D241 Benign neoplasm of right breast: Secondary | ICD-10-CM | POA: Diagnosis not present

## 2022-08-25 DIAGNOSIS — D242 Benign neoplasm of left breast: Secondary | ICD-10-CM | POA: Diagnosis not present

## 2022-08-27 DIAGNOSIS — Z30431 Encounter for routine checking of intrauterine contraceptive device: Secondary | ICD-10-CM | POA: Diagnosis not present

## 2022-09-10 ENCOUNTER — Encounter (INDEPENDENT_AMBULATORY_CARE_PROVIDER_SITE_OTHER): Payer: Self-pay

## 2022-10-08 ENCOUNTER — Ambulatory Visit (INDEPENDENT_AMBULATORY_CARE_PROVIDER_SITE_OTHER): Payer: BC Managed Care – PPO | Admitting: Family Medicine

## 2022-10-08 ENCOUNTER — Encounter: Payer: Self-pay | Admitting: Family Medicine

## 2022-10-08 VITALS — BP 118/80 | HR 73 | Temp 97.9°F | Resp 18 | Ht 66.0 in | Wt 183.4 lb

## 2022-10-08 DIAGNOSIS — F32A Depression, unspecified: Secondary | ICD-10-CM

## 2022-10-08 DIAGNOSIS — Z0000 Encounter for general adult medical examination without abnormal findings: Secondary | ICD-10-CM | POA: Diagnosis not present

## 2022-10-08 DIAGNOSIS — E663 Overweight: Secondary | ICD-10-CM | POA: Diagnosis not present

## 2022-10-08 DIAGNOSIS — F419 Anxiety disorder, unspecified: Secondary | ICD-10-CM | POA: Diagnosis not present

## 2022-10-08 DIAGNOSIS — F331 Major depressive disorder, recurrent, moderate: Secondary | ICD-10-CM | POA: Diagnosis not present

## 2022-10-08 LAB — CBC WITH DIFFERENTIAL/PLATELET
Basophils Absolute: 0 10*3/uL (ref 0.0–0.1)
Basophils Relative: 0.7 % (ref 0.0–3.0)
Eosinophils Absolute: 0.1 10*3/uL (ref 0.0–0.7)
Eosinophils Relative: 1.8 % (ref 0.0–5.0)
HCT: 39.8 % (ref 36.0–46.0)
Hemoglobin: 13.2 g/dL (ref 12.0–15.0)
Lymphocytes Relative: 21.5 % (ref 12.0–46.0)
Lymphs Abs: 1 10*3/uL (ref 0.7–4.0)
MCHC: 33.3 g/dL (ref 30.0–36.0)
MCV: 91.1 fl (ref 78.0–100.0)
Monocytes Absolute: 0.4 10*3/uL (ref 0.1–1.0)
Monocytes Relative: 8.5 % (ref 3.0–12.0)
Neutro Abs: 3.3 10*3/uL (ref 1.4–7.7)
Neutrophils Relative %: 67.5 % (ref 43.0–77.0)
Platelets: 370 10*3/uL (ref 150.0–400.0)
RBC: 4.37 Mil/uL (ref 3.87–5.11)
RDW: 13.4 % (ref 11.5–15.5)
WBC: 4.9 10*3/uL (ref 4.0–10.5)

## 2022-10-08 LAB — HEPATIC FUNCTION PANEL
ALT: 16 U/L (ref 0–35)
AST: 23 U/L (ref 0–37)
Albumin: 4 g/dL (ref 3.5–5.2)
Alkaline Phosphatase: 45 U/L (ref 39–117)
Bilirubin, Direct: 0.1 mg/dL (ref 0.0–0.3)
Total Bilirubin: 0.6 mg/dL (ref 0.2–1.2)
Total Protein: 6.3 g/dL (ref 6.0–8.3)

## 2022-10-08 LAB — LIPID PANEL
Cholesterol: 122 mg/dL (ref 0–200)
HDL: 40.3 mg/dL (ref >39.00)
LDL Cholesterol: 71 mg/dL (ref 0–99)
NonHDL: 81.75
Total CHOL/HDL Ratio: 3
Triglycerides: 53 mg/dL (ref 0.0–149.0)
VLDL: 10.6 mg/dL (ref 0.0–40.0)

## 2022-10-08 LAB — BASIC METABOLIC PANEL
BUN: 9 mg/dL (ref 6–23)
CO2: 27 mEq/L (ref 19–32)
Calcium: 9.4 mg/dL (ref 8.4–10.5)
Chloride: 105 mEq/L (ref 96–112)
Creatinine, Ser: 0.67 mg/dL (ref 0.40–1.20)
GFR: 109.7 mL/min (ref >60.00)
Glucose, Bld: 93 mg/dL (ref 70–99)
Potassium: 4.4 mEq/L (ref 3.5–5.1)
Sodium: 138 mEq/L (ref 135–145)

## 2022-10-08 LAB — TSH: TSH: 2.05 u[IU]/mL (ref 0.35–5.50)

## 2022-10-08 MED ORDER — BUSPIRONE HCL 7.5 MG PO TABS: 7.5000 mg | ORAL_TABLET | Freq: Two times a day (BID) | ORAL | 3 refills | Status: DC

## 2022-10-08 NOTE — Progress Notes (Signed)
   Subjective:    Patient ID: Sharon [name], female    DOB: Dec 28, 1980, 42 y.o.   MRN: [id number]  HPI CPE- UTD on pap, Tdap.  Patient Care Team    Relationship Specialty Notifications Start End  Sheliah Hatch, MD PCP - General Family Medicine  03/11/17   Olivia Mackie, MD Consulting Physician Obstetrics and Gynecology  10/02/21      Health Maintenance  Topic Date Due   INFLUENZA VACCINE  05/11/2023 (Originally 09/11/2022)   PAP SMEAR-Modifier  07/24/2024   DTaP/Tdap/Td (2 - Td or Tdap) 03/16/2026   HIV Screening  Completed   HPV VACCINES  Aged Out   COVID-19 Vaccine  Discontinued   Hepatitis C Screening  Discontinued      Review of Systems Patient reports no vision/ hearing changes, adenopathy,fever,  persistant/recurrent hoarseness , swallowing issues, chest pain, palpitations, edema, persistant/recurrent cough, hemoptysis, dyspnea (rest/exertional/paroxysmal nocturnal), gastrointestinal bleeding (melena, rectal bleeding), abdominal pain, significant heartburn, bowel changes, GU symptoms (dysuria, hematuria, incontinence), Gyn symptoms (abnormal  bleeding, pain),  syncope, focal weakness, memory loss, numbness & tingling, skin/hair/nail changes, abnormal bruising or bleeding.   + 7 lb weight gain + anxiety/depression- stopped her desvenlafaxine after forgetting to take it on vacation.  She reports the medication helped with depression, not anxiety, and seemed to make her flat.  Currently is struggling w/ anxiety and not depression.  Has a fear of dropping kids off at school daily due to fear of school shootings.  Reports she will feel this way for the next '100 days or so'.      Objective:   Physical Exam General Appearance:    Alert, cooperative, no distress, appears stated age  Head:    Normocephalic, without obvious abnormality, atraumatic  Eyes:    PERRL, conjunctiva/corneas clear, EOM's intact both eyes  Ears:    Normal TM's and external ear canals, both ears  Nose:    Nares normal, septum midline, mucosa normal, no drainage    or sinus tenderness  Throat:   Lips, mucosa, and tongue normal; teeth and gums normal  Neck:   Supple, symmetrical, trachea midline, no adenopathy;    Thyroid: no enlargement/tenderness/nodules  Back:     Symmetric, no curvature, ROM normal, no CVA tenderness  Lungs:     Clear to auscultation bilaterally, respirations unlabored  Chest Wall:    No tenderness or deformity   Heart:    Regular rate and rhythm, S1 and S2 normal, no murmur, rub   or gallop  Breast Exam:    Deferred to GYN  Abdomen:     Soft, non-tender, bowel sounds active all four quadrants,    no masses, no organomegaly  Genitalia:    Deferred to GYN  Rectal:    Extremities:   Extremities normal, atraumatic, no cyanosis or edema  Pulses:   2+ and symmetric all extremities  Skin:   Skin color, texture, turgor normal, no rashes or lesions  Lymph nodes:   Cervical, supraclavicular, and axillary nodes normal  Neurologic:   CNII-XII intact, normal strength, sensation and reflexes    throughout          Assessment & Plan:

## 2022-10-08 NOTE — Patient Instructions (Signed)
Follow up in 6 weeks to recheck mood We'll notify you of your lab results and make any changes if needed Continue to work on healthy diet and regular exercise- you can do it! START the Buspirone twice daily for anxiety Call with any questions or concerns Stay Safe!  Stay Healthy! Happy Early [place] Ouch!!

## 2022-10-08 NOTE — Assessment & Plan Note (Signed)
Deteriorated.  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  She worries daily about dropping them off b/c she fears it will be the last time she sees them due to possible school shooting.  States she knows fear is irrational, but does not stop her from having it recur daily.  Will start Buspar 7.5mg  BID and monitor closely for improvement.  Pt expressed understanding and is in agreement w/ plan.

## 2022-10-08 NOTE — Assessment & Plan Note (Signed)
Pt's PE WNL w/ exception of BMI.  UTD on pap, Tdap.  Will start mammo next year.  Check labs.  Anticipatory guidance provided.

## 2022-10-09 ENCOUNTER — Telehealth: Payer: Self-pay

## 2022-10-09 NOTE — Telephone Encounter (Signed)
-----   Message from Neena Rhymes sent at 10/09/2022  7:29 AM EDT ----- Labs look great!  No changes at this time

## 2022-10-09 NOTE — Telephone Encounter (Signed)
PT seen results Via my chart

## 2022-10-22 DIAGNOSIS — F331 Major depressive disorder, recurrent, moderate: Secondary | ICD-10-CM | POA: Diagnosis not present

## 2022-10-29 DIAGNOSIS — F331 Major depressive disorder, recurrent, moderate: Secondary | ICD-10-CM | POA: Diagnosis not present

## 2022-10-30 ENCOUNTER — Other Ambulatory Visit: Payer: Self-pay | Admitting: Family Medicine

## 2022-11-05 DIAGNOSIS — Z803 Family history of malignant neoplasm of breast: Secondary | ICD-10-CM | POA: Diagnosis not present

## 2022-11-05 DIAGNOSIS — Z1239 Encounter for other screening for malignant neoplasm of breast: Secondary | ICD-10-CM | POA: Diagnosis not present

## 2022-11-12 DIAGNOSIS — F331 Major depressive disorder, recurrent, moderate: Secondary | ICD-10-CM | POA: Diagnosis not present

## 2022-11-19 ENCOUNTER — Telehealth (INDEPENDENT_AMBULATORY_CARE_PROVIDER_SITE_OTHER): Payer: BC Managed Care – PPO | Admitting: Family Medicine

## 2022-11-19 ENCOUNTER — Encounter: Payer: Self-pay | Admitting: Family Medicine

## 2022-11-19 VITALS — Temp 98.6°F | Ht 66.0 in | Wt 179.0 lb

## 2022-11-19 DIAGNOSIS — F32A Depression, unspecified: Secondary | ICD-10-CM

## 2022-11-19 DIAGNOSIS — N3281 Overactive bladder: Secondary | ICD-10-CM | POA: Insufficient documentation

## 2022-11-19 DIAGNOSIS — F419 Anxiety disorder, unspecified: Secondary | ICD-10-CM | POA: Diagnosis not present

## 2022-11-19 MED ORDER — MIRABEGRON ER 50 MG PO TB24: 50.0000 mg | ORAL_TABLET | Freq: Every day | ORAL | 3 refills | Status: AC

## 2022-11-19 NOTE — Progress Notes (Signed)
   Virtual Visit via Video   I connected with patient on 11/19/22 at  8:20 AM EDT by a video enabled telemedicine application and verified that I am speaking with the correct person using two identifiers.  Location patient: Home Location provider: [facility], Office Persons participating in the virtual visit: Patient, Provider, CMA Archie Patten H)  I discussed the limitations of evaluation and management by telemedicine and the availability of in person appointments. The patient expressed understanding and agreed to proceed.  Subjective:   HPI:   Anxiety- at last visit we added Buspar 7.5mg  BID.  Pt feels medication has helped significantly.  Finds drop off to be easier.    OAB- pt has seen urology and pelvic floor therapy.  Needs refill on Myrbetriq  ROS:   See pertinent positives and negatives per HPI.  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   Allergic rhinitis 04/09/2017   Hx of gestational diabetes mellitus, not currently pregnant 03/11/2017   Overweight (BMI 25.0-29.9) 03/11/2017   Disordered sleep 03/11/2017    Social History   Tobacco Use   Smoking status: Never   Smokeless tobacco: Never  Substance Use Topics   Alcohol use: Not Currently    Alcohol/week: 2.0 - 10.0 standard drinks of alcohol    Types: 2 - 10 Glasses of wine per week    Current Outpatient Medications:    busPIRone (BUSPAR) 7.5 MG  tablet, Take 1 tablet (7.5 mg total) by mouth 2 (two) times daily., Disp: 60 tablet, Rfl: 3   Melatonin 10 MG TABS, Take 10 mg by mouth daily., Disp: , Rfl:    mirabegron ER (MYRBETRIQ) 50 MG TB24 tablet, Take 1 tablet (50 mg total) by mouth daily., Disp: 90 tablet, Rfl: 3  No Known Allergies  Objective:   Temp 98.6 F (37 C)   Ht 5\' 6"  (1.676 m)   Wt 179 lb (81.2 kg)   BMI 28.89 kg/m  AAOx3, NAD NCAT, EOMI No obvious CN deficits Coloring WNL Pt is able to speak clearly, coherently without shortness of breath or increased work of breathing.  Thought process is linear.  Mood is appropriate.   Assessment and Plan:   Anxiety/Depression- improving.  Pt reports anxiety is much better since starting Buspar.  Feels irrational thoughts are less and not as intrusive.  Feels current dose is appropriate.  No med changes at this time.  OAB- improving.  Pt feels pelvic floor PT has been very helpful and will only take Myrbetriq as needed.  Refill provided.   Neena Rhymes, MD 11/19/2022

## 2022-12-03 DIAGNOSIS — F331 Major depressive disorder, recurrent, moderate: Secondary | ICD-10-CM | POA: Diagnosis not present

## 2022-12-04 ENCOUNTER — Other Ambulatory Visit: Payer: Self-pay | Admitting: Family Medicine

## 2022-12-04 MED ORDER — DESVENLAFAXINE SUCCINATE ER 25 MG PO TB24: 25.0000 mg | ORAL_TABLET | Freq: Every day | ORAL | 1 refills | Status: AC

## 2022-12-04 NOTE — Telephone Encounter (Signed)
Left vm to call office to clarify

## 2022-12-04 NOTE — Telephone Encounter (Signed)
Please advise 

## 2022-12-04 NOTE — Telephone Encounter (Signed)
Prescription sent to pharmacy as requested.

## 2022-12-04 NOTE — Telephone Encounter (Signed)
She is taking this medication as prescribed

## 2022-12-04 NOTE — Addendum Note (Signed)
Addended by: Sheliah Hatch on: 12/04/2022 04:41 PM   Modules accepted: Orders

## 2022-12-10 DIAGNOSIS — F331 Major depressive disorder, recurrent, moderate: Secondary | ICD-10-CM | POA: Diagnosis not present

## 2022-12-24 DIAGNOSIS — F331 Major depressive disorder, recurrent, moderate: Secondary | ICD-10-CM | POA: Diagnosis not present

## 2022-12-25 ENCOUNTER — Ambulatory Visit: Payer: BC Managed Care – PPO | Admitting: Family Medicine

## 2022-12-25 ENCOUNTER — Encounter: Payer: Self-pay | Admitting: Family Medicine

## 2022-12-25 VITALS — BP 110/68 | HR 78 | Temp 98.1°F | Ht 66.0 in | Wt 186.0 lb

## 2022-12-25 DIAGNOSIS — R051 Acute cough: Secondary | ICD-10-CM

## 2022-12-25 MED ORDER — AZITHROMYCIN 250 MG PO TABS: ORAL_TABLET | ORAL | 0 refills | Status: DC

## 2022-12-25 MED ORDER — ALBUTEROL SULFATE HFA 108 (90 BASE) MCG/ACT IN AERS: 2.0000 | INHALATION_SPRAY | Freq: Four times a day (QID) | RESPIRATORY_TRACT | 0 refills | Status: AC | PRN

## 2022-12-25 NOTE — Progress Notes (Signed)
   Subjective:    Patient ID: Sharon [name], female    DOB: 04-16-1980, 42 y.o.   MRN: [id number]  HPI Cough- started 3 weeks ago.  Cough preceded recent COVID dx.  Other COVID sxs have resolved but cough remains.  On Friday took a deep breath and felt 'crackles' in her R lower lung.  She reports R side of chest feels 'heavier'.  No pain.  Cough is intermittently productive.   Review of Systems For ROS see HPI     Objective:   Physical Exam Vitals reviewed.  Constitutional:      General: She is not in acute distress.    Appearance: Normal appearance. She is not ill-appearing.  HENT:     Head: Normocephalic and atraumatic.  Cardiovascular:     Rate and Rhythm: Normal rate and regular rhythm.     Heart sounds: Normal heart sounds.  Pulmonary:     Effort: Pulmonary effort is normal. No respiratory distress.     Breath sounds: Rhonchi (faint crackles of RLL) present. No wheezing.     Comments: Dry, hacking cough Musculoskeletal:     Cervical back: Neck supple.  Lymphadenopathy:     Cervical: No cervical adenopathy.  Skin:    General: Skin is warm and dry.  Neurological:     General: No focal deficit present.     Mental Status: She is alert and oriented to person, place, and time.  Psychiatric:        Mood and Affect: Mood normal.        Behavior: Behavior normal.        Thought Content: Thought content normal.           Assessment & Plan:   Cough- new.  Sxs started 3 weeks ago.  Cough became a bit more productive when she had COVID and increased drainage, but otherwise the cough has been unchanged.  No fevers.  Now having crackles she can hear when she takes deep breaths or coughs.  Given the community surge of mycoplasma PNA, will treat w/ Azithromycin.  Albuterol as needed for cough and SOB.  Pt expressed understanding and is in agreement w/ plan.

## 2022-12-25 NOTE — Patient Instructions (Signed)
Follow up as needed or as scheduled START the Zpack- 2 tabs today and then 1 pill daily USE the albuterol inhaler as needed for coughing or shortness of breath Delsym or Mucinex DM for cough Drink LOTS of fluids REST!!! Call with any questions or concerns Hang In There!!

## 2022-12-31 DIAGNOSIS — F331 Major depressive disorder, recurrent, moderate: Secondary | ICD-10-CM | POA: Diagnosis not present

## 2023-01-12 DIAGNOSIS — Z0189 Encounter for other specified special examinations: Secondary | ICD-10-CM | POA: Diagnosis not present

## 2023-01-12 DIAGNOSIS — Z9189 Other specified personal risk factors, not elsewhere classified: Secondary | ICD-10-CM | POA: Diagnosis not present

## 2023-01-14 DIAGNOSIS — F331 Major depressive disorder, recurrent, moderate: Secondary | ICD-10-CM | POA: Diagnosis not present

## 2023-01-21 DIAGNOSIS — F331 Major depressive disorder, recurrent, moderate: Secondary | ICD-10-CM | POA: Diagnosis not present

## 2023-02-18 DIAGNOSIS — E663 Overweight: Secondary | ICD-10-CM | POA: Diagnosis not present

## 2023-02-18 DIAGNOSIS — N6021 Fibroadenosis of right breast: Secondary | ICD-10-CM | POA: Diagnosis not present

## 2023-02-18 DIAGNOSIS — Z1501 Genetic susceptibility to malignant neoplasm of breast: Secondary | ICD-10-CM | POA: Diagnosis not present

## 2023-02-18 DIAGNOSIS — Z803 Family history of malignant neoplasm of breast: Secondary | ICD-10-CM | POA: Diagnosis not present

## 2023-02-18 DIAGNOSIS — N6489 Other specified disorders of breast: Secondary | ICD-10-CM | POA: Diagnosis not present

## 2023-02-18 DIAGNOSIS — N6011 Diffuse cystic mastopathy of right breast: Secondary | ICD-10-CM | POA: Diagnosis not present

## 2023-02-18 DIAGNOSIS — Z79899 Other long term (current) drug therapy: Secondary | ICD-10-CM | POA: Diagnosis not present

## 2023-02-18 DIAGNOSIS — N6082 Other benign mammary dysplasias of left breast: Secondary | ICD-10-CM | POA: Diagnosis not present

## 2023-02-18 DIAGNOSIS — N6081 Other benign mammary dysplasias of right breast: Secondary | ICD-10-CM | POA: Diagnosis not present

## 2023-02-18 DIAGNOSIS — Z6829 Body mass index (BMI) 29.0-29.9, adult: Secondary | ICD-10-CM | POA: Diagnosis not present

## 2023-02-18 DIAGNOSIS — N6012 Diffuse cystic mastopathy of left breast: Secondary | ICD-10-CM | POA: Diagnosis not present

## 2023-02-18 DIAGNOSIS — Z9189 Other specified personal risk factors, not elsewhere classified: Secondary | ICD-10-CM | POA: Diagnosis not present

## 2023-02-18 DIAGNOSIS — N62 Hypertrophy of breast: Secondary | ICD-10-CM | POA: Diagnosis not present

## 2023-02-18 DIAGNOSIS — N6022 Fibroadenosis of left breast: Secondary | ICD-10-CM | POA: Diagnosis not present

## 2023-03-03 DIAGNOSIS — Z09 Encounter for follow-up examination after completed treatment for conditions other than malignant neoplasm: Secondary | ICD-10-CM | POA: Diagnosis not present

## 2023-03-03 DIAGNOSIS — Z9189 Other specified personal risk factors, not elsewhere classified: Secondary | ICD-10-CM | POA: Diagnosis not present

## 2023-03-17 ENCOUNTER — Other Ambulatory Visit: Payer: Self-pay | Admitting: Family Medicine

## 2023-03-25 DIAGNOSIS — F331 Major depressive disorder, recurrent, moderate: Secondary | ICD-10-CM | POA: Diagnosis not present

## 2023-04-12 ENCOUNTER — Other Ambulatory Visit: Payer: Self-pay | Admitting: Family Medicine

## 2023-04-16 DIAGNOSIS — F411 Generalized anxiety disorder: Secondary | ICD-10-CM | POA: Diagnosis not present

## 2023-04-16 DIAGNOSIS — F609 Personality disorder, unspecified: Secondary | ICD-10-CM | POA: Diagnosis not present

## 2023-04-16 DIAGNOSIS — F339 Major depressive disorder, recurrent, unspecified: Secondary | ICD-10-CM | POA: Diagnosis not present

## 2023-04-16 DIAGNOSIS — F909 Attention-deficit hyperactivity disorder, unspecified type: Secondary | ICD-10-CM | POA: Diagnosis not present

## 2023-04-27 ENCOUNTER — Other Ambulatory Visit: Payer: Self-pay | Admitting: Family Medicine

## 2023-04-29 DIAGNOSIS — F331 Major depressive disorder, recurrent, moderate: Secondary | ICD-10-CM | POA: Diagnosis not present

## 2023-05-11 DIAGNOSIS — Z9889 Other specified postprocedural states: Secondary | ICD-10-CM | POA: Diagnosis not present

## 2023-05-11 DIAGNOSIS — Z9189 Other specified personal risk factors, not elsewhere classified: Secondary | ICD-10-CM | POA: Diagnosis not present

## 2023-05-11 DIAGNOSIS — Z48817 Encounter for surgical aftercare following surgery on the skin and subcutaneous tissue: Secondary | ICD-10-CM | POA: Diagnosis not present

## 2023-06-03 DIAGNOSIS — F909 Attention-deficit hyperactivity disorder, unspecified type: Secondary | ICD-10-CM | POA: Diagnosis not present

## 2023-06-03 DIAGNOSIS — F411 Generalized anxiety disorder: Secondary | ICD-10-CM | POA: Diagnosis not present

## 2023-06-03 DIAGNOSIS — F339 Major depressive disorder, recurrent, unspecified: Secondary | ICD-10-CM | POA: Diagnosis not present

## 2023-06-03 DIAGNOSIS — F609 Personality disorder, unspecified: Secondary | ICD-10-CM | POA: Diagnosis not present

## 2023-06-08 ENCOUNTER — Encounter: Payer: Self-pay | Admitting: Family Medicine

## 2023-06-08 ENCOUNTER — Ambulatory Visit: Admitting: Family Medicine

## 2023-06-08 VITALS — BP 102/60 | HR 63 | Temp 97.9°F | Wt 190.6 lb

## 2023-06-08 DIAGNOSIS — F419 Anxiety disorder, unspecified: Secondary | ICD-10-CM | POA: Diagnosis not present

## 2023-06-08 DIAGNOSIS — F32A Depression, unspecified: Secondary | ICD-10-CM

## 2023-06-08 MED ORDER — CLONIDINE HCL 0.1 MG PO TABS: 0.1000 mg | ORAL_TABLET | Freq: Every day | ORAL | 3 refills | Status: DC

## 2023-06-08 MED ORDER — BUSPIRONE HCL 15 MG PO TABS: 15.0000 mg | ORAL_TABLET | Freq: Two times a day (BID) | ORAL | 3 refills | Status: DC

## 2023-06-08 NOTE — Patient Instructions (Signed)
 Follow up in 4 weeks to recheck mood INCREASE the Buspar  to 15mg  twice daily START the Clonidine nightly CONTINUE the Pristiq  daily Call with any questions or concerns Stay Safe!  Stay Healthy!

## 2023-06-08 NOTE — Assessment & Plan Note (Signed)
 Deteriorated.  Pt reports she is back to waking ~15x/night due to anxiety and stress.  She has tried all prescription sleep medications- as well as OTC treatments- and has not had success.  She also had a sleep study.  Will increase Buspar  to 15mg  BID and continue Pristiq .  Add Clonidine at night to help w/ both anxiety and sleep.  Pt expressed understanding and is in agreement w/ plan.

## 2023-06-08 NOTE — Progress Notes (Signed)
   Subjective:    Patient ID: Sharon [name], female    DOB: [birth year], 43 y.o.   MRN: [id number]  HPI Anxiety- pt reports she is still not sleeping due to anxiety.  Waking up ~15x/night.  Currently on Buspar  7.5mg  BID, Pristiq  25mg  daily.  Pt feels that the addition of Buspar  was helpful but stressors have increased- BIL died in 2023/04/07, has preventative mastectomy upcoming.  Has been through all sleep medication in the past w/o relief.  Also had sleep study.   Review of Systems For ROS see HPI     Objective:   Physical Exam Vitals reviewed.  Constitutional:      General: She is not in acute distress.    Appearance: Normal appearance. She is not ill-appearing.  HENT:     Head: Normocephalic and atraumatic.  Eyes:     Extraocular Movements: Extraocular movements intact.     Conjunctiva/sclera: Conjunctivae normal.  Cardiovascular:     Rate and Rhythm: Normal rate and regular rhythm.  Pulmonary:     Effort: Pulmonary effort is normal. No respiratory distress.  Skin:    General: Skin is warm and dry.  Neurological:     General: No focal deficit present.     Mental Status: She is alert and oriented to person, place, and time.  Psychiatric:        Mood and Affect: Mood normal.        Behavior: Behavior normal.        Thought Content: Thought content normal.           Assessment & Plan:

## 2023-06-10 DIAGNOSIS — F331 Major depressive disorder, recurrent, moderate: Secondary | ICD-10-CM | POA: Diagnosis not present

## 2023-06-15 DIAGNOSIS — F609 Personality disorder, unspecified: Secondary | ICD-10-CM | POA: Diagnosis not present

## 2023-06-15 DIAGNOSIS — F411 Generalized anxiety disorder: Secondary | ICD-10-CM | POA: Diagnosis not present

## 2023-06-15 DIAGNOSIS — F339 Major depressive disorder, recurrent, unspecified: Secondary | ICD-10-CM | POA: Diagnosis not present

## 2023-06-15 DIAGNOSIS — F909 Attention-deficit hyperactivity disorder, unspecified type: Secondary | ICD-10-CM | POA: Diagnosis not present

## 2023-06-25 DIAGNOSIS — F331 Major depressive disorder, recurrent, moderate: Secondary | ICD-10-CM | POA: Diagnosis not present

## 2023-07-02 DIAGNOSIS — Z1231 Encounter for screening mammogram for malignant neoplasm of breast: Secondary | ICD-10-CM | POA: Diagnosis not present

## 2023-07-02 DIAGNOSIS — Z9189 Other specified personal risk factors, not elsewhere classified: Secondary | ICD-10-CM | POA: Diagnosis not present

## 2023-07-02 DIAGNOSIS — Z803 Family history of malignant neoplasm of breast: Secondary | ICD-10-CM | POA: Diagnosis not present

## 2023-07-02 DIAGNOSIS — R92343 Mammographic extreme density, bilateral breasts: Secondary | ICD-10-CM | POA: Diagnosis not present

## 2023-07-02 DIAGNOSIS — Z1239 Encounter for other screening for malignant neoplasm of breast: Secondary | ICD-10-CM | POA: Diagnosis not present

## 2023-07-09 ENCOUNTER — Telehealth (INDEPENDENT_AMBULATORY_CARE_PROVIDER_SITE_OTHER): Admitting: Family Medicine

## 2023-07-09 ENCOUNTER — Ambulatory Visit: Admitting: Family Medicine

## 2023-07-09 ENCOUNTER — Encounter: Payer: Self-pay | Admitting: Family Medicine

## 2023-07-09 VITALS — BP 118/79 | Temp 98.6°F | Ht 67.0 in | Wt 187.0 lb

## 2023-07-09 DIAGNOSIS — F32A Depression, unspecified: Secondary | ICD-10-CM | POA: Diagnosis not present

## 2023-07-09 DIAGNOSIS — F419 Anxiety disorder, unspecified: Secondary | ICD-10-CM

## 2023-07-09 NOTE — Progress Notes (Signed)
 Virtual Visit via Video   I connected with patient on 07/09/23 at  8:20 AM EDT by a video enabled telemedicine application and verified that I am speaking with the correct person using two identifiers.  Location patient: Home Location provider: [facility], Office Persons participating in the virtual visit: Patient, Provider, CMA Kedric Passy)  I discussed the limitations of evaluation and management by telemedicine and the availability of in person appointments. The patient expressed understanding and agreed to proceed.  Subjective:   HPI:   Anxiety/Depression- at last visit we increased the Buspar  to 15mg  BID and started Clonidine  nightly.  Pt feels that higher dose of Buspar  has been helpful- 'it's taken even more of the edge off'.  Clonidine  is helpful w/ sleep- if she takes w/ OTC Olly Stress capsules she is only waking 2x/night which is 'unheard of' for her.  ROS:   See pertinent positives and negatives per HPI.  Patient Active Problem List   Diagnosis Date Noted   OAB (overactive bladder) 11/19/2022   At high risk for breast cancer 11/30/2019   Genetic testing 09/01/2019   Physical exam 08/04/2019   Family history of gene mutation    Family history of breast cancer    Family history of lung cancer    Family history of prostate cancer    Family history of esophageal cancer    Family history of non-Hodgkin's lymphoma    Family history of leukemia    Family history of brain cancer    Anxiety and depression 08/16/2018   Abnormal REM sleep 09/23/2017   Excessive daytime sleepiness 09/23/2017   Sleep paralysis, 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   Allergic rhinitis 04/09/2017   Hx of gestational diabetes mellitus, not currently pregnant 03/11/2017   Overweight (BMI 25.0-29.9) 03/11/2017   Disordered sleep 03/11/2017    Social History   Tobacco Use   Smoking status: Never   Smokeless tobacco: Never  Substance Use Topics   Alcohol use: Not Currently     Alcohol/week: 2.0 - 10.0 standard drinks of alcohol    Types: 2 - 10 Glasses of wine per week    Current Outpatient Medications:    busPIRone  (BUSPAR ) 15 MG tablet, Take 1 tablet (15 mg total) by mouth 2 (two) times daily., Disp: 60 tablet, Rfl: 3   cloNIDine  (CATAPRES ) 0.1 MG tablet, Take 1 tablet (0.1 mg total) by mouth at bedtime., Disp: 30 tablet, Rfl: 3   desvenlafaxine  (PRISTIQ ) 25 MG 24 hr tablet, Take 1 tablet (25 mg total) by mouth daily., Disp: 90 tablet, Rfl: 1   Melatonin 10 MG TABS, Take 10 mg by mouth daily., Disp: , Rfl:    mirabegron  ER (MYRBETRIQ ) 50 MG TB24 tablet, Take 1 tablet (50 mg total) by mouth daily., Disp: 90 tablet, Rfl: 3   albuterol  (VENTOLIN  HFA) 108 (90 Base) MCG/ACT inhaler, Inhale 2 puffs into the lungs every 6 (six) hours as needed for wheezing or shortness of breath. (Patient not taking: Reported on 07/09/2023), Disp: 8 g, Rfl: 0  No Known Allergies  Objective:   BP 118/79 Comment: Patient reported.  Temp 98.6 F (37 C)   Ht 5\' 7"  (1.702 m)   Wt 187 lb (84.8 kg)   BMI 29.29 kg/m  AAOx3, NAD NCAT, EOMI No obvious CN deficits Coloring WNL Pt is able to speak clearly, coherently without shortness of breath or increased work of breathing.  Thought process is linear.  Mood is appropriate.   Assessment and Plan:   Anxiety/Depression- improving.  Pt feels that anxiety is under better control w/ increased dose of Buspar .  Will continue 15mg  BID.  Sleep has also improved w/ addition of Clonidine .  No changes at this time.  Will follow.   Laymon Priest, MD 07/09/2023

## 2023-07-15 DIAGNOSIS — F331 Major depressive disorder, recurrent, moderate: Secondary | ICD-10-CM | POA: Diagnosis not present

## 2023-07-21 DIAGNOSIS — Z803 Family history of malignant neoplasm of breast: Secondary | ICD-10-CM | POA: Diagnosis not present

## 2023-07-21 DIAGNOSIS — Z421 Encounter for breast reconstruction following mastectomy: Secondary | ICD-10-CM | POA: Diagnosis not present

## 2023-07-21 DIAGNOSIS — Z79899 Other long term (current) drug therapy: Secondary | ICD-10-CM | POA: Diagnosis not present

## 2023-07-21 DIAGNOSIS — G8918 Other acute postprocedural pain: Secondary | ICD-10-CM | POA: Diagnosis not present

## 2023-07-21 DIAGNOSIS — Z9013 Acquired absence of bilateral breasts and nipples: Secondary | ICD-10-CM | POA: Diagnosis not present

## 2023-07-21 DIAGNOSIS — Z4001 Encounter for prophylactic removal of breast: Secondary | ICD-10-CM | POA: Diagnosis not present

## 2023-07-21 DIAGNOSIS — Z6829 Body mass index (BMI) 29.0-29.9, adult: Secondary | ICD-10-CM | POA: Diagnosis not present

## 2023-07-21 DIAGNOSIS — Z801 Family history of malignant neoplasm of trachea, bronchus and lung: Secondary | ICD-10-CM | POA: Diagnosis not present

## 2023-07-21 DIAGNOSIS — E663 Overweight: Secondary | ICD-10-CM | POA: Diagnosis not present

## 2023-07-21 DIAGNOSIS — N6012 Diffuse cystic mastopathy of left breast: Secondary | ICD-10-CM | POA: Diagnosis not present

## 2023-07-21 DIAGNOSIS — G4719 Other hypersomnia: Secondary | ICD-10-CM | POA: Diagnosis not present

## 2023-07-21 DIAGNOSIS — N6011 Diffuse cystic mastopathy of right breast: Secondary | ICD-10-CM | POA: Diagnosis not present

## 2023-07-21 DIAGNOSIS — N3281 Overactive bladder: Secondary | ICD-10-CM | POA: Diagnosis not present

## 2023-07-28 DIAGNOSIS — Z9189 Other specified personal risk factors, not elsewhere classified: Secondary | ICD-10-CM | POA: Diagnosis not present

## 2023-07-28 DIAGNOSIS — Z9889 Other specified postprocedural states: Secondary | ICD-10-CM | POA: Diagnosis not present

## 2023-07-28 DIAGNOSIS — Z9013 Acquired absence of bilateral breasts and nipples: Secondary | ICD-10-CM | POA: Diagnosis not present

## 2023-07-29 DIAGNOSIS — F331 Major depressive disorder, recurrent, moderate: Secondary | ICD-10-CM | POA: Diagnosis not present

## 2023-08-04 DIAGNOSIS — Z9189 Other specified personal risk factors, not elsewhere classified: Secondary | ICD-10-CM | POA: Diagnosis not present

## 2023-08-04 DIAGNOSIS — Z9013 Acquired absence of bilateral breasts and nipples: Secondary | ICD-10-CM | POA: Diagnosis not present

## 2023-08-04 DIAGNOSIS — Z9889 Other specified postprocedural states: Secondary | ICD-10-CM | POA: Diagnosis not present

## 2023-08-05 DIAGNOSIS — F331 Major depressive disorder, recurrent, moderate: Secondary | ICD-10-CM | POA: Diagnosis not present

## 2023-08-19 DIAGNOSIS — F331 Major depressive disorder, recurrent, moderate: Secondary | ICD-10-CM | POA: Diagnosis not present

## 2023-08-21 DIAGNOSIS — Z9189 Other specified personal risk factors, not elsewhere classified: Secondary | ICD-10-CM | POA: Diagnosis not present

## 2023-08-21 DIAGNOSIS — Z9889 Other specified postprocedural states: Secondary | ICD-10-CM | POA: Diagnosis not present

## 2023-08-21 DIAGNOSIS — Z9013 Acquired absence of bilateral breasts and nipples: Secondary | ICD-10-CM | POA: Diagnosis not present

## 2023-08-27 DIAGNOSIS — Z09 Encounter for follow-up examination after completed treatment for conditions other than malignant neoplasm: Secondary | ICD-10-CM | POA: Diagnosis not present

## 2023-08-27 DIAGNOSIS — Z9013 Acquired absence of bilateral breasts and nipples: Secondary | ICD-10-CM | POA: Diagnosis not present

## 2023-08-27 DIAGNOSIS — Z803 Family history of malignant neoplasm of breast: Secondary | ICD-10-CM | POA: Diagnosis not present

## 2023-09-20 DIAGNOSIS — H00011 Hordeolum externum right upper eyelid: Secondary | ICD-10-CM | POA: Diagnosis not present

## 2023-09-30 DIAGNOSIS — F331 Major depressive disorder, recurrent, moderate: Secondary | ICD-10-CM | POA: Diagnosis not present

## 2023-10-05 DIAGNOSIS — Z9013 Acquired absence of bilateral breasts and nipples: Secondary | ICD-10-CM | POA: Diagnosis not present

## 2023-10-05 DIAGNOSIS — Z9889 Other specified postprocedural states: Secondary | ICD-10-CM | POA: Diagnosis not present

## 2023-10-06 ENCOUNTER — Other Ambulatory Visit: Payer: Self-pay | Admitting: Family Medicine

## 2023-10-14 DIAGNOSIS — F331 Major depressive disorder, recurrent, moderate: Secondary | ICD-10-CM | POA: Diagnosis not present

## 2023-10-16 ENCOUNTER — Telehealth: Payer: Self-pay

## 2023-10-16 NOTE — Telephone Encounter (Signed)
 Last OV was in April she will need an appointment to discuss this further

## 2023-10-16 NOTE — Telephone Encounter (Signed)
 Copied from CRM #[id number]. Topic: Referral - Question >> Oct 16, 2023 12:59 PM Pinkey ORN wrote: Reason for CRM: Referral Question / Request >> Oct 16, 2023  1:00 PM Pinkey ORN wrote: Patient is wanting to know if Mahlon Comer BRAVO, MD would refer her to a functional neurologist. Patient is requesting a follow up via mychart.

## 2023-10-19 NOTE — Telephone Encounter (Signed)
 LMTCB to make appt

## 2023-10-20 ENCOUNTER — Encounter: Payer: Self-pay | Admitting: Family Medicine

## 2023-10-20 NOTE — Telephone Encounter (Signed)
 LMTCB and make an appointment to be seen

## 2023-10-20 NOTE — Telephone Encounter (Addendum)
 LMTCB to schedule appointment with provider - letter out as well

## 2023-10-28 DIAGNOSIS — F331 Major depressive disorder, recurrent, moderate: Secondary | ICD-10-CM | POA: Diagnosis not present

## 2023-11-04 DIAGNOSIS — F331 Major depressive disorder, recurrent, moderate: Secondary | ICD-10-CM | POA: Diagnosis not present

## 2023-12-07 DIAGNOSIS — Z9013 Acquired absence of bilateral breasts and nipples: Secondary | ICD-10-CM | POA: Diagnosis not present

## 2023-12-23 DIAGNOSIS — L988 Other specified disorders of the skin and subcutaneous tissue: Secondary | ICD-10-CM | POA: Diagnosis not present

## 2023-12-23 DIAGNOSIS — Z9189 Other specified personal risk factors, not elsewhere classified: Secondary | ICD-10-CM | POA: Diagnosis not present

## 2023-12-23 DIAGNOSIS — N6489 Other specified disorders of breast: Secondary | ICD-10-CM | POA: Diagnosis not present

## 2023-12-23 DIAGNOSIS — Z79899 Other long term (current) drug therapy: Secondary | ICD-10-CM | POA: Diagnosis not present

## 2023-12-23 DIAGNOSIS — Z9013 Acquired absence of bilateral breasts and nipples: Secondary | ICD-10-CM | POA: Diagnosis not present

## 2023-12-23 DIAGNOSIS — Z853 Personal history of malignant neoplasm of breast: Secondary | ICD-10-CM | POA: Diagnosis not present

## 2023-12-23 DIAGNOSIS — E663 Overweight: Secondary | ICD-10-CM | POA: Diagnosis not present

## 2023-12-23 DIAGNOSIS — Z421 Encounter for breast reconstruction following mastectomy: Secondary | ICD-10-CM | POA: Diagnosis not present

## 2023-12-23 DIAGNOSIS — Z9889 Other specified postprocedural states: Secondary | ICD-10-CM | POA: Diagnosis not present

## 2024-01-04 DIAGNOSIS — Z9013 Acquired absence of bilateral breasts and nipples: Secondary | ICD-10-CM | POA: Diagnosis not present
